# Patient Record
Sex: Male | Born: 1960 | Race: Black or African American | Hispanic: No | Marital: Single | Smoking: Current every day smoker
Health system: Southern US, Community
[De-identification: ages and names within clinical notes are randomized; demographics above are authoritative.]

## PROBLEM LIST (undated history)

## (undated) DIAGNOSIS — F1411 Cocaine abuse, in remission: Secondary | ICD-10-CM

## (undated) DIAGNOSIS — J449 Chronic obstructive pulmonary disease, unspecified: Secondary | ICD-10-CM

## (undated) DIAGNOSIS — E782 Mixed hyperlipidemia: Secondary | ICD-10-CM

## (undated) DIAGNOSIS — H409 Unspecified glaucoma: Secondary | ICD-10-CM

## (undated) DIAGNOSIS — G510 Bell's palsy: Secondary | ICD-10-CM

## (undated) DIAGNOSIS — I10 Essential (primary) hypertension: Secondary | ICD-10-CM

## (undated) DIAGNOSIS — Z8679 Personal history of other diseases of the circulatory system: Secondary | ICD-10-CM

## (undated) HISTORY — DX: Essential (primary) hypertension: I10

## (undated) HISTORY — DX: Personal history of other diseases of the circulatory system: Z86.79

## (undated) HISTORY — PX: CATARACT EXTRACTION: SUR2

## (undated) HISTORY — PX: OTHER SURGICAL HISTORY: SHX169

## (undated) HISTORY — DX: Cocaine abuse, in remission: F14.11

## (undated) HISTORY — DX: Unspecified glaucoma: H40.9

## (undated) HISTORY — DX: Chronic obstructive pulmonary disease, unspecified: J44.9

## (undated) HISTORY — DX: Bell's palsy: G51.0

## (undated) HISTORY — DX: Mixed hyperlipidemia: E78.2

---

## 2017-02-22 DIAGNOSIS — I639 Cerebral infarction, unspecified: Secondary | ICD-10-CM

## 2017-02-22 HISTORY — DX: Cerebral infarction, unspecified: I63.9

## 2017-08-07 ENCOUNTER — Ambulatory Visit (HOSPITAL_COMMUNITY)
Admission: RE | Admit: 2017-08-07 | Discharge: 2017-08-07 | Disposition: A | Discharge status: Home / Self Care | Payer: Medicaid Other | Source: Ambulatory Visit | Attending: Internal Medicine | Admitting: Internal Medicine

## 2017-08-07 ENCOUNTER — Other Ambulatory Visit (HOSPITAL_COMMUNITY): Payer: Self-pay | Admitting: Internal Medicine

## 2017-08-07 DIAGNOSIS — M25521 Pain in right elbow: Secondary | ICD-10-CM | POA: Diagnosis not present

## 2017-08-07 DIAGNOSIS — R52 Pain, unspecified: Secondary | ICD-10-CM

## 2017-08-07 DIAGNOSIS — M79631 Pain in right forearm: Secondary | ICD-10-CM | POA: Insufficient documentation

## 2017-08-31 ENCOUNTER — Emergency Department (HOSPITAL_COMMUNITY): Payer: Medicaid Other

## 2017-08-31 ENCOUNTER — Observation Stay (HOSPITAL_COMMUNITY)
Admission: EM | Admit: 2017-08-31 | Discharge: 2017-09-02 | Disposition: A | Discharge status: Home / Self Care | Payer: Medicaid Other | Attending: Internal Medicine | Admitting: Internal Medicine

## 2017-08-31 ENCOUNTER — Encounter (HOSPITAL_COMMUNITY): Payer: Self-pay | Admitting: Emergency Medicine

## 2017-08-31 ENCOUNTER — Observation Stay (HOSPITAL_COMMUNITY): Payer: Medicaid Other

## 2017-08-31 ENCOUNTER — Other Ambulatory Visit: Payer: Self-pay

## 2017-08-31 DIAGNOSIS — G518 Other disorders of facial nerve: Principal | ICD-10-CM | POA: Insufficient documentation

## 2017-08-31 DIAGNOSIS — R29898 Other symptoms and signs involving the musculoskeletal system: Secondary | ICD-10-CM

## 2017-08-31 DIAGNOSIS — R29810 Facial weakness: Secondary | ICD-10-CM | POA: Diagnosis not present

## 2017-08-31 DIAGNOSIS — R202 Paresthesia of skin: Secondary | ICD-10-CM | POA: Insufficient documentation

## 2017-08-31 DIAGNOSIS — Z8673 Personal history of transient ischemic attack (TIA), and cerebral infarction without residual deficits: Secondary | ICD-10-CM | POA: Insufficient documentation

## 2017-08-31 DIAGNOSIS — G510 Bell's palsy: Secondary | ICD-10-CM

## 2017-08-31 DIAGNOSIS — F17210 Nicotine dependence, cigarettes, uncomplicated: Secondary | ICD-10-CM | POA: Diagnosis not present

## 2017-08-31 DIAGNOSIS — I639 Cerebral infarction, unspecified: Secondary | ICD-10-CM

## 2017-08-31 DIAGNOSIS — I10 Essential (primary) hypertension: Secondary | ICD-10-CM

## 2017-08-31 DIAGNOSIS — H02401 Unspecified ptosis of right eyelid: Secondary | ICD-10-CM | POA: Diagnosis not present

## 2017-08-31 DIAGNOSIS — Z79899 Other long term (current) drug therapy: Secondary | ICD-10-CM | POA: Insufficient documentation

## 2017-08-31 DIAGNOSIS — Z7982 Long term (current) use of aspirin: Secondary | ICD-10-CM | POA: Diagnosis not present

## 2017-08-31 DIAGNOSIS — Z720 Tobacco use: Secondary | ICD-10-CM

## 2017-08-31 DIAGNOSIS — J449 Chronic obstructive pulmonary disease, unspecified: Secondary | ICD-10-CM | POA: Diagnosis not present

## 2017-08-31 LAB — PROTIME-INR
INR: 1.06
Prothrombin Time: 13.7 seconds (ref 11.4–15.2)

## 2017-08-31 LAB — BASIC METABOLIC PANEL
Anion gap: 7 (ref 5–15)
BUN: 17 mg/dL (ref 6–20)
CO2: 27 mmol/L (ref 22–32)
Calcium: 9.5 mg/dL (ref 8.9–10.3)
Chloride: 105 mmol/L (ref 98–111)
Creatinine, Ser: 0.9 mg/dL (ref 0.61–1.24)
GFR calc Af Amer: >60 mL/min (ref >60)
GFR calc non Af Amer: >60 mL/min (ref >60)
Glucose, Bld: 92 mg/dL (ref 70–99)
Potassium: 3.8 mmol/L (ref 3.5–5.1)
Sodium: 139 mmol/L (ref 135–145)

## 2017-08-31 LAB — CBC WITH DIFFERENTIAL/PLATELET
Basophils Absolute: 0 10*3/uL (ref 0.0–0.1)
Basophils Relative: 0 %
Eosinophils Absolute: 0.2 10*3/uL (ref 0.0–0.7)
Eosinophils Relative: 3 %
HCT: 43.7 % (ref 39.0–52.0)
Hemoglobin: 15 g/dL (ref 13.0–17.0)
Lymphocytes Relative: 29 %
Lymphs Abs: 1.8 10*3/uL (ref 0.7–4.0)
MCH: 31.6 pg (ref 26.0–34.0)
MCHC: 34.3 g/dL (ref 30.0–36.0)
MCV: 92.2 fL (ref 78.0–100.0)
Monocytes Absolute: 0.6 10*3/uL (ref 0.1–1.0)
Monocytes Relative: 9 %
Neutro Abs: 3.7 10*3/uL (ref 1.7–7.7)
Neutrophils Relative %: 59 %
Platelets: 193 10*3/uL (ref 150–400)
RBC: 4.74 MIL/uL (ref 4.22–5.81)
RDW: 13.4 % (ref 11.5–15.5)
WBC: 6.3 10*3/uL (ref 4.0–10.5)

## 2017-08-31 LAB — URINALYSIS, ROUTINE W REFLEX MICROSCOPIC
Bilirubin Urine: NEGATIVE
Glucose, UA: NEGATIVE mg/dL
Hgb urine dipstick: NEGATIVE
Ketones, ur: NEGATIVE mg/dL
Leukocytes, UA: NEGATIVE
Nitrite: NEGATIVE
Protein, ur: NEGATIVE mg/dL
Specific Gravity, Urine: 1.018 (ref 1.005–1.030)
pH: 5 (ref 5.0–8.0)

## 2017-08-31 LAB — APTT: aPTT: 29 seconds (ref 24–36)

## 2017-08-31 MED ORDER — ATORVASTATIN CALCIUM 40 MG PO TABS
40.0000 mg | ORAL_TABLET | Freq: Every day | ORAL | Status: DC
  Administered 2017-08-31 – 2017-09-02 (×3): 40 mg via ORAL
  Filled 2017-08-31 (×3): qty 1

## 2017-08-31 MED ORDER — STROKE: EARLY STAGES OF RECOVERY BOOK
Freq: Once | Status: DC
  Filled 2017-08-31: qty 1

## 2017-08-31 MED ORDER — ACETAMINOPHEN 650 MG RE SUPP: 650.0000 mg | RECTAL | Status: DC | PRN

## 2017-08-31 MED ORDER — CLOPIDOGREL BISULFATE 75 MG PO TABS
75.0000 mg | ORAL_TABLET | Freq: Every day | ORAL | Status: DC
  Administered 2017-09-01: 75 mg via ORAL
  Filled 2017-08-31: qty 1

## 2017-08-31 MED ORDER — ACETAMINOPHEN 160 MG/5ML PO SOLN: 650.0000 mg | ORAL | Status: DC | PRN

## 2017-08-31 MED ORDER — ACETAMINOPHEN 500 MG PO TABS
1000.0000 mg | ORAL_TABLET | Freq: Once | ORAL | Status: AC
  Administered 2017-08-31: 1000 mg via ORAL
  Filled 2017-08-31: qty 2

## 2017-08-31 MED ORDER — LATANOPROST 0.005 % OP SOLN
OPHTHALMIC | Status: AC
  Filled 2017-08-31: qty 2.5

## 2017-08-31 MED ORDER — ACETAMINOPHEN 325 MG PO TABS: 650.0000 mg | ORAL_TABLET | ORAL | Status: DC | PRN

## 2017-08-31 MED ORDER — NICOTINE 14 MG/24HR TD PT24
14.0000 mg | MEDICATED_PATCH | Freq: Every day | TRANSDERMAL | Status: DC
  Administered 2017-08-31 – 2017-09-02 (×3): 14 mg via TRANSDERMAL
  Filled 2017-08-31 (×3): qty 1

## 2017-08-31 MED ORDER — ASPIRIN 300 MG RE SUPP
300.0000 mg | Freq: Every day | RECTAL | Status: DC
  Filled 2017-08-31: qty 1

## 2017-08-31 MED ORDER — PREDNISOLONE ACETATE 1 % OP SUSP
OPHTHALMIC | Status: AC
  Filled 2017-08-31: qty 5

## 2017-08-31 MED ORDER — BRIMONIDINE TARTRATE 0.15 % OP SOLN
1.0000 [drp] | Freq: Three times a day (TID) | OPHTHALMIC | Status: DC
  Administered 2017-09-01 – 2017-09-02 (×5): 1 [drp] via OPHTHALMIC
  Filled 2017-08-31: qty 5

## 2017-08-31 MED ORDER — SERTRALINE HCL 50 MG PO TABS
50.0000 mg | ORAL_TABLET | Freq: Every day | ORAL | Status: DC
  Administered 2017-09-01 – 2017-09-02 (×2): 50 mg via ORAL
  Filled 2017-08-31 (×2): qty 1

## 2017-08-31 MED ORDER — PREDNISOLONE ACETATE 1 % OP SUSP
1.0000 [drp] | Freq: Two times a day (BID) | OPHTHALMIC | Status: DC
  Administered 2017-09-01 – 2017-09-02 (×3): 1 [drp] via OPHTHALMIC
  Filled 2017-08-31: qty 1

## 2017-08-31 MED ORDER — FUROSEMIDE 40 MG PO TABS
40.0000 mg | ORAL_TABLET | Freq: Two times a day (BID) | ORAL | Status: DC
  Administered 2017-09-01 – 2017-09-02 (×4): 40 mg via ORAL
  Filled 2017-08-31 (×5): qty 1

## 2017-08-31 MED ORDER — DORZOLAMIDE HCL-TIMOLOL MAL 2-0.5 % OP SOLN
1.0000 [drp] | Freq: Two times a day (BID) | OPHTHALMIC | Status: DC
  Administered 2017-09-01 – 2017-09-02 (×3): 1 [drp] via OPHTHALMIC
  Filled 2017-08-31: qty 10

## 2017-08-31 MED ORDER — ASPIRIN 81 MG PO CHEW
324.0000 mg | CHEWABLE_TABLET | Freq: Once | ORAL | Status: AC
  Administered 2017-08-31: 324 mg via ORAL
  Filled 2017-08-31: qty 4

## 2017-08-31 MED ORDER — MOMETASONE FURO-FORMOTEROL FUM 100-5 MCG/ACT IN AERO
2.0000 | INHALATION_SPRAY | Freq: Two times a day (BID) | RESPIRATORY_TRACT | Status: DC
  Administered 2017-08-31 – 2017-09-02 (×4): 2 via RESPIRATORY_TRACT
  Filled 2017-08-31 (×2): qty 8.8

## 2017-08-31 MED ORDER — LATANOPROST 0.005 % OP SOLN
1.0000 [drp] | Freq: Every day | OPHTHALMIC | Status: DC
  Administered 2017-09-01: 1 [drp] via OPHTHALMIC
  Filled 2017-08-31: qty 2.5

## 2017-08-31 MED ORDER — ASPIRIN 325 MG PO TABS
325.0000 mg | ORAL_TABLET | Freq: Every day | ORAL | Status: DC
  Administered 2017-09-01: 325 mg via ORAL
  Filled 2017-08-31: qty 1

## 2017-08-31 MED ORDER — ENOXAPARIN SODIUM 40 MG/0.4ML ~~LOC~~ SOLN
40.0000 mg | SUBCUTANEOUS | Status: DC
  Administered 2017-08-31 – 2017-09-01 (×2): 40 mg via SUBCUTANEOUS
  Filled 2017-08-31 (×3): qty 0.4

## 2017-08-31 MED ORDER — SODIUM CHLORIDE 0.9 % IV SOLN
INTRAVENOUS | Status: DC
  Administered 2017-08-31 – 2017-09-02 (×3): via INTRAVENOUS

## 2017-08-31 NOTE — ED Notes (Signed)
Pt transported to CT ?

## 2017-08-31 NOTE — ED Notes (Signed)
teleneurologist contacted at this time for consult. Nija Koopman

## 2017-08-31 NOTE — H&P (Signed)
History and Physical    Dustin Fuller ZOX:[id number] DOB: [birth year] DOA: 08/31/2017  PCP: Benita Stabile, MD  Patient coming from: Home  I have personally briefly reviewed patient's old medical records in [facility]  Chief Complaint: Right facial numbness and weakness  HPI: Dustin Fuller is a 57 y.o. male with medical history significant of atrial fibrillation in the past, status post ablation currently in sinus rhythm, history of stroke in the past, COPD, Bell's palsy, hypertension, ongoing tobacco use presents to the hospital with right facial numbness and weakness.  Patient reports that for the past 1 to 2 days, he has been experiencing right facial numbness, occasional paresthesias and some weakness.  He transiently noted that his speech was slurred, but this is resolved.  He is also noted numbness and paresthesias in his right arm.  To a lesser degree in his right leg.  He is not had any difficulty walking or trouble with balance.  He has recently had eye surgery, but has not noticed any abrupt changes in vision in the last 1 to 2 days.  No fever, cough, vomiting, diarrhea.  ED Course: CT scan of the head in the emergency room did not show any acute process.  Basic labs were unrevealing.  Due to concern for underlying CVA, he is been referred for admission.  Review of Systems: As per HPI otherwise 10 point review of systems negative.    Past Medical History:  Diagnosis Date  . Stroke [facility])     History reviewed. No pertinent surgical history.   reports that he has been smoking cigarettes.  He has been smoking about 0.25 packs per day. He has never used smokeless tobacco. He reports that he drank alcohol. He reports that he has current or past drug history.  Not on File  Family history: Family history reviewed and not pertinent  Prior to Admission medications   Medication Sig Start Date End Date Taking? Authorizing Provider  albuterol (PROVENTIL HFA) 108 (90 Base)  MCG/ACT inhaler Inhale 2 puffs into the lungs every 4 (four) hours as needed. 06/16/17  Yes [provider]  ALPHAGAN P 0.1 % SOLN Place 1 drop into both eyes. 08/15/17  Yes [provider]  aspirin 81 MG EC tablet Take 1 tablet by mouth daily. 06/17/17 06/17/18 Yes [provider]  diclofenac (VOLTAREN) 75 MG EC tablet Take 75 mg by mouth 2 (two) times daily. for 10 days 08/06/17  Yes [provider]  dorzolamide-timolol (COSOPT) 22.3-6.8 MG/ML ophthalmic solution Place 1 drop into both eyes 2 (two) times daily. 08/26/17  Yes [provider]  furosemide (LASIX) 40 MG tablet Take 40 mg by mouth 2 (two) times daily. 08/15/17  Yes [provider]  labetalol (NORMODYNE) 200 MG tablet Take 200 mg by mouth 2 (two) times daily. 08/15/17  Yes [provider]  latanoprost (XALATAN) 0.005 % ophthalmic solution Place 1 drop into both eyes at bedtime. 08/15/17  Yes [provider]  losartan (COZAAR) 100 MG tablet Take 100 mg by mouth daily. 08/15/17  Yes [provider]  NIFEdipine (PROCARDIA-XL/ADALAT-CC/NIFEDICAL-XL) 30 MG 24 hr tablet Take 30 mg by mouth daily. 08/15/17  Yes [provider]  prednisoLONE acetate (PRED FORTE) 1 % ophthalmic suspension Place 1 drop into both eyes 2 (two) times daily. 08/15/17  Yes [provider]  sertraline (ZOLOFT) 50 MG tablet Take 50 mg by mouth daily. 07/19/17  Yes [provider]  SYMBICORT 80-4.5 MCG/ACT inhaler Inhale 2 puffs  into the lungs 2 (two) times daily. 08/15/17  Yes [provider]    Physical Exam: Vitals:   08/31/17 1400 08/31/17 1430 08/31/17 1500 08/31/17 1530  BP: (!) 148/88 127/75 138/89 133/80  Pulse: 76 71 72 73  Resp: 19 16 14 18   Temp:      TempSrc:      SpO2: 98% 100% 98% 97%    Constitutional: NAD, calm, comfortable Vitals:   08/31/17 1400 08/31/17 1430 08/31/17 1500 08/31/17 1530  BP: (!) 148/88 127/75 138/89 133/80  Pulse: 76 71 72  73  Resp: 19 16 14 18   Temp:      TempSrc:      SpO2: 98% 100% 98% 97%   Eyes: PERRL, lids and conjunctivae normal ENMT: Mucous membranes are moist. Posterior pharynx clear of any exudate or lesions.Normal dentition.  Neck: normal, supple, no masses, no thyromegaly Respiratory: clear to auscultation bilaterally, no wheezing, no crackles. Normal respiratory effort. No accessory muscle use.  Cardiovascular: Regular rate and rhythm, no murmurs / rubs / gallops. No extremity edema. 2+ pedal pulses. No carotid bruits.  Abdomen: no tenderness, no masses palpated. No hepatosplenomegaly. Bowel sounds positive.  Musculoskeletal: no clubbing / cyanosis. No joint deformity upper and lower extremities. Good ROM, no contractures. Normal muscle tone.  Skin: no rashes, lesions, ulcers. No induration Neurologic: Strength is equal bilaterally.  Mild right-sided facial droop.  Grossly, cranial nerves are intact Psychiatric: Normal judgment and insight. Alert and oriented x 3. Normal mood.    Labs on Admission: I have personally reviewed following labs and imaging studies  CBC: Recent Labs  Lab 08/31/17 1313  WBC 6.3  NEUTROABS 3.7  HGB 15.0  HCT 43.7  MCV 92.2  PLT 193   Basic Metabolic Panel: Recent Labs  Lab 08/31/17 1313  NA 139  K 3.8  CL 105  CO2 27  GLUCOSE 92  BUN 17  CREATININE 0.90  CALCIUM 9.5   GFR: CrCl cannot be calculated (Unknown ideal weight.). Liver Function Tests: No results for input(s): AST, ALT, ALKPHOS, BILITOT, PROT, ALBUMIN in the last 168 hours. No results for input(s): LIPASE, AMYLASE in the last 168 hours. No results for input(s): AMMONIA in the last 168 hours. Coagulation Profile: Recent Labs  Lab 08/31/17 1313  INR 1.06   Cardiac Enzymes: No results for input(s): CKTOTAL, CKMB, CKMBINDEX, TROPONINI in the last 168 hours. BNP (last 3 results) No results for input(s): PROBNP in the last 8760 hours. HbA1C: No results for input(s): HGBA1C in the  last 72 hours. CBG: No results for input(s): GLUCAP in the last 168 hours. Lipid Profile: No results for input(s): CHOL, HDL, LDLCALC, TRIG, CHOLHDL, LDLDIRECT in the last 72 hours. Thyroid Function Tests: No results for input(s): TSH, T4TOTAL, FREET4, T3FREE, THYROIDAB in the last 72 hours. Anemia Panel: No results for input(s): VITAMINB12, FOLATE, FERRITIN, TIBC, IRON, RETICCTPCT in the last 72 hours. Urine analysis:    Component Value Date/Time   COLORURINE YELLOW 08/31/2017 1313   APPEARANCEUR CLEAR 08/31/2017 1313   LABSPEC 1.018 08/31/2017 1313   PHURINE 5.0 08/31/2017 1313   GLUCOSEU NEGATIVE 08/31/2017 1313   HGBUR NEGATIVE 08/31/2017 1313   BILIRUBINUR NEGATIVE 08/31/2017 1313   KETONESUR NEGATIVE 08/31/2017 1313   PROTEINUR NEGATIVE 08/31/2017 1313   NITRITE NEGATIVE 08/31/2017 1313   LEUKOCYTESUR NEGATIVE 08/31/2017 1313    Radiological Exams on Admission: Ct Head Wo Contrast  Result Date: 08/31/2017 CLINICAL DATA:  Slurred speech beginning yesterday. EXAM: CT HEAD WITHOUT CONTRAST TECHNIQUE:  Contiguous axial images were obtained from the base of the skull through the vertex without intravenous contrast. COMPARISON:  None. FINDINGS: Brain: The brain shows a normal appearance without evidence of malformation, atrophy, old or acute small or large vessel infarction, mass lesion, hemorrhage, hydrocephalus or extra-axial collection. Vascular: There is atherosclerotic calcification of the major vessels at the base of the brain. Skull: Normal.  No traumatic finding.  No focal bone lesion. Sinuses/Orbits: Sinuses are clear. Orbits appear normal. Mastoids are clear. Other: None significant IMPRESSION: Normal head CT with exception of considerable atherosclerotic calcification of the major vessels at the base of the brain. Electronically Signed   By: Paulina Fusi M.D.   On: 08/31/2017 13:56    EKG: Independently reviewed.  Sinus rhythm without acute changes  Assessment/Plan Active  Problems:   Facial droop   Tobacco abuse   COPD (chronic obstructive pulmonary disease) (HCC)   HTN (hypertension)     1. Right facial droop.  Concern for underlying CVA.  Seen by tele neurology.  Not a candidate for TPA due to delay in patient arrival.  Will start on aspirin Plavix.  Further work-up with MRI, carotid Dopplers.  Start high intensity statin.  Check lipid panel and A1c.  Physical therapy has been ordered. 2. COPD.  No evidence of wheezing.  Continue bronchodilators. 3. Tobacco use.  Nicotine patch. 4. Hypertension.  Hold antihypertensives to allow for permissive hypertension.  DVT prophylaxis: lovenox  Code Status: full code  Family Communication: no family present  Disposition Plan: discharge home once improved  Consults called: Neurology Admission status: Observation, telemetry  Erick Blinks MD [facility] Pager [phone]  If 7PM-7AM, please contact night-coverage [email or web address] Password [email or web address]  08/31/2017, 4:55 PM

## 2017-08-31 NOTE — Plan of Care (Signed)
  Problem: Education: Goal: Knowledge of General Education information will improve Outcome: Progressing   Problem: Health Behavior/Discharge Planning: Goal: Ability to manage health-related needs will improve Outcome: Progressing   Problem: Coping: Goal: Will verbalize positive feelings about self Outcome: Progressing Goal: Will identify appropriate support needs Outcome: Progressing   Problem: Health Behavior/Discharge Planning: Goal: Ability to manage health-related needs will improve Outcome: Progressing   Problem: Self-Care: Goal: Ability to communicate needs accurately will improve Outcome: Progressing

## 2017-08-31 NOTE — ED Provider Notes (Signed)
[facility] EMERGENCY DEPARTMENT Provider Note   CSN: [id number] Arrival date & time: 08/31/17  1257   An emergency department physician performed an initial assessment on this suspected stroke patient at 1312.  History   Chief Complaint Chief Complaint  Patient presents with  . Facial Droop    HPI Lorik Guo is a 57 y.o. male.  HPI   57 year old male with right sided facial weakness and right upper extremity weakness.  First noticed around 6 AM yesterday.  Persistent since then.  He first noticed a change in his speech and then progressed to facial numbness/weakness.  Some change in taste.  No change in his hearing.  Denies any acute pain.  Some tingling in his right upper extremity but denies any weakness.  He reports a prior history of stroke but no residual deficits.  Past Medical History:  Diagnosis Date  . Stroke [facility])     There are no active problems to display for this patient.   History reviewed. No pertinent surgical history.      Home Medications    Prior to Admission medications   Medication Sig Start Date End Date Taking? Authorizing Provider  albuterol (PROVENTIL HFA) 108 (90 Base) MCG/ACT inhaler Inhale 2 puffs into the lungs every 4 (four) hours as needed. 06/16/17  Yes [provider]  ALPHAGAN P 0.1 % SOLN Place 1 drop into both eyes. 08/15/17  Yes [provider]  aspirin 81 MG EC tablet Take 1 tablet by mouth daily. 06/17/17 06/17/18 Yes [provider]  diclofenac (VOLTAREN) 75 MG EC tablet Take 75 mg by mouth 2 (two) times daily. for 10 days 08/06/17  Yes [provider]  dorzolamide-timolol (COSOPT) 22.3-6.8 MG/ML ophthalmic solution Place 1 drop into both eyes 2 (two) times daily. 08/26/17  Yes [provider]  furosemide (LASIX) 40 MG tablet Take 40 mg by mouth 2 (two) times daily. 08/15/17  Yes [provider]  labetalol (NORMODYNE) 200 MG tablet Take 200 mg by mouth 2 (two) times daily.  08/15/17  Yes [provider]  latanoprost (XALATAN) 0.005 % ophthalmic solution Place 1 drop into both eyes at bedtime. 08/15/17  Yes [provider]  losartan (COZAAR) 100 MG tablet Take 100 mg by mouth daily. 08/15/17  Yes [provider]  NIFEdipine (PROCARDIA-XL/ADALAT-CC/NIFEDICAL-XL) 30 MG 24 hr tablet Take 30 mg by mouth daily. 08/15/17  Yes [provider]  prednisoLONE acetate (PRED FORTE) 1 % ophthalmic suspension Place 1 drop into both eyes 2 (two) times daily. 08/15/17  Yes [provider]  sertraline (ZOLOFT) 50 MG tablet Take 50 mg by mouth daily. 07/19/17  Yes [provider]  SYMBICORT 80-4.5 MCG/ACT inhaler Inhale 2 puffs into the lungs 2 (two) times daily. 08/15/17  Yes [provider]    Family History No family history on file.  Social History Social History   Tobacco Use  . Smoking status: Current Every Day Smoker    Packs/day: 0.25    Types: Cigarettes  . Smokeless tobacco: Never Used  Substance Use Topics  . Alcohol use: Not Currently  . Drug use: Not Currently     Allergies   Patient has no allergy information on record.   Review of Systems Review of Systems  All systems reviewed and negative, other than as noted in HPI.  Physical Exam Updated Vital Signs BP (!) 146/89 (BP Location: Left Arm)   Pulse 80   Temp 97.8 F (36.6 C)   Resp 19  SpO2 99%   Physical Exam  Constitutional: He is oriented to person, place, and time. He appears well-developed and well-nourished. No distress.  HENT:  Head: Normocephalic and atraumatic.  Eyes: Conjunctivae are normal. Right eye exhibits no discharge. Left eye exhibits no discharge.  Neck: Neck supple.  Cardiovascular: Normal rate, regular rhythm and normal heart sounds. Exam reveals no gallop and no friction rub.  No murmur heard. Pulmonary/Chest: Effort normal and breath sounds normal. No respiratory distress.  Abdominal: Soft. He exhibits no  distension. There is no tenderness.  Musculoskeletal: He exhibits no edema or tenderness.  Neurological: He is alert and oriented to person, place, and time.  Right facial droop.  Right eye ptosis.  His forehead does NOT appear to be spared. CN 2-12 otherwise intact. Pronator drift on right.  Is 5 out of 5 all other extremities.  Sensation is intact to light touch.  Good finger-to-nose testing bilaterally.  Skin: Skin is warm and dry.  Psychiatric: He has a normal mood and affect. His behavior is normal. Thought content normal.  Nursing note and vitals reviewed.    ED Treatments / Results  Labs (all labs ordered are listed, but only abnormal results are displayed) Labs Reviewed  HEMOGLOBIN A1C - Abnormal; Notable for the following components:      Result Value   Hgb A1c MFr Bld 6.1 (*)    All other components within normal limits  LIPID PANEL - Abnormal; Notable for the following components:   Cholesterol 206 (*)    LDL Cholesterol 135 (*)    All other components within normal limits  CBC WITH DIFFERENTIAL/PLATELET  BASIC METABOLIC PANEL  URINALYSIS, ROUTINE W REFLEX MICROSCOPIC  APTT  PROTIME-INR  VITAMIN B12  TSH  C-REACTIVE PROTEIN  SEDIMENTATION RATE  HIV ANTIBODY (ROUTINE TESTING)  HOMOCYSTEINE  RPR  ANTINUCLEAR ANTIBODIES, IFA    EKG None   Radiology Ct Head Wo Contrast  Result Date: 08/31/2017 CLINICAL DATA:  Slurred speech beginning yesterday. EXAM: CT HEAD WITHOUT CONTRAST TECHNIQUE: Contiguous axial images were obtained from the base of the skull through the vertex without intravenous contrast. COMPARISON:  None. FINDINGS: Brain: The brain shows a normal appearance without evidence of malformation, atrophy, old or acute small or large vessel infarction, mass lesion, hemorrhage, hydrocephalus or extra-axial collection. Vascular: There is atherosclerotic calcification of the major vessels at the base of the brain. Skull: Normal.  No traumatic finding.  No focal  bone lesion. Sinuses/Orbits: Sinuses are clear. Orbits appear normal. Mastoids are clear. Other: None significant IMPRESSION: Normal head CT with exception of considerable atherosclerotic calcification of the major vessels at the base of the brain. Electronically Signed   By: Paulina FusiMark  Shogry M.D.   On: 08/31/2017 13:56    Procedures Procedures (including critical care time)  Medications Ordered in ED Medications  aspirin chewable tablet 324 mg (324 mg Oral Given 08/31/17 1410)  acetaminophen (TYLENOL) tablet 1,000 mg (1,000 mg Oral Given 08/31/17 1410)     Initial Impression / Assessment and Plan / ED Course  I have reviewed the triage vital signs and the nursing notes.  Pertinent labs & imaging results that were available during my care of the patient were reviewed by me and considered in my medical decision making (see chart for details).    57 year old male with right facial weakness.  Forehead does not seem spared to me.  Consider Bell's palsy although he has other symptoms not isolated to a facial nerve palsy.  CT the head does  not show any acute abnormality.  Will obtain tele-neurology consultation.  Admission for further work-up.  Final Clinical Impressions(s) / ED Diagnoses   Final diagnoses:  Facial nerve palsy  Right arm weakness    ED Discharge Orders    None       Raeford Razor, MD 09/02/17 [phone]

## 2017-08-31 NOTE — ED Notes (Signed)
Tele neuro consult at this time.

## 2017-08-31 NOTE — ED Triage Notes (Signed)
Pt states 6 am on Tuesday morning he notice he was having slurred speech. Hx strokes, pt did not want to come in. Today he has onset of right sided facial droop.

## 2017-08-31 NOTE — Consult Note (Signed)
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  He is not a candidate for acute thrombolytic therapy or any sort of emergent treatment.  He does however need a workup for stroke evaluation.  Because he does have evidence of possibly some atheromatous disease in the posterior circulation with what appears to be a more brainstem type clinical picture, recommend high intensity statin therapy, doing antiplatelet therapy with aspirin 81 mg and Plavix 75 mg until his diagnosis can be better clarified.  He does of course have a history of atrial fibrillation although he's been told this was successfully ablated could also be an additional mechanism of stroke.  Would allow permissive hypertension for non-TPA stroke protocol until 6 AM tomorrow morning.  Recommend inpatient neurology consultation and evaluation.  He'll need speech therapy, physical and occupational therapy evaluations.    --------------------------------------------------------------------- CC: Right-sided weakness slurred speech  History of Present Illness: The patient is a very pleasant 57 year old gentleman with a history of atrial fibrillation status post ablation who only takes aspirin daily.  Also has a history of hypertension and recent cataract and glaucoma surgery.  He smokes one half a pack per day of cigarettes.  Yesterday at 6 in the morning he noted the onset of right-sided weakness and his face arm and leg with some numbness as well.  He also noted some droopiness to his right eyelid.  He has no history of stroke.  When the symptoms became a little bit worse he presented to the hospital for evaluation. Diagnostic Testing: CT head without contrast no acute changes  Vital Signs: Blood pressure moderately elevated afebrile  Exam: 1a- LOC: Keenly responsive - =0     1b- LOC questions: Answers both questions  correctly - 0     1c- LOC commands- Performs both tasks correctly- 0     2- Gaze: Normal; no gaze paresis or gaze deviation - 0     3- Visual Fields: normal, no Visual field deficit - 0     4- Facial movements: right facial asymmetry=1 5- Upper limb motor - no drift -0    No drift but feels right arm and leg are heavier 6- Lower limb motor - no drift - 0      7- Limb Coordination: absent ataxia - 0      8- Sensory : dec R face/arm/leg =1   9- Language - No aphasia - 0      10- Speech - No dysarthria -0   =1 11Extinction - none found -0     NIHSS score  3   Medical Decision Making: - Extensive number of diagnosis or management options are considered above. - Extensive amount of complex data reviewed. - High risk of complication and/or morbidity or mortality are associated with differential diagnostic considerations above.  - There may be uncertain outcome and increased probability of prolonged functional impairment or high probability of severe prolonged functional impairment associated with some of these differential diagnosis.  Medical Data Reviewed: 1.Data reviewed include clinical labs, radiology, Medical Tests; 2.Tests results discussed w/performing or interpreting physician; 3.Obtaining/reviewing old medical records; 4.Obtaining case history from another source; 5.Independent review of image, tracing or specimen. Patient was informed the Neurology Consult would happen via telehealth (remote video) and consented to receiving care in this manner.

## 2017-09-01 ENCOUNTER — Observation Stay (HOSPITAL_COMMUNITY): Payer: Medicaid Other

## 2017-09-01 ENCOUNTER — Observation Stay (HOSPITAL_BASED_OUTPATIENT_CLINIC_OR_DEPARTMENT_OTHER): Payer: Medicaid Other

## 2017-09-01 DIAGNOSIS — Z720 Tobacco use: Secondary | ICD-10-CM | POA: Diagnosis not present

## 2017-09-01 DIAGNOSIS — J449 Chronic obstructive pulmonary disease, unspecified: Secondary | ICD-10-CM | POA: Diagnosis not present

## 2017-09-01 DIAGNOSIS — I10 Essential (primary) hypertension: Secondary | ICD-10-CM

## 2017-09-01 LAB — ECHOCARDIOGRAM COMPLETE
Height: 71 in
WEIGHTICAEL: 3468.8 [oz_av]

## 2017-09-01 LAB — SEDIMENTATION RATE: Sed Rate: 1 mm/hr (ref 0–16)

## 2017-09-01 LAB — LIPID PANEL
Cholesterol: 206 mg/dL — ABNORMAL HIGH (ref 0–200)
HDL: 56 mg/dL (ref >40)
LDL CALC: 135 mg/dL — AB (ref 0–99)
Total CHOL/HDL Ratio: 3.7 RATIO
Triglycerides: 75 mg/dL (ref <150)
VLDL: 15 mg/dL (ref 0–40)

## 2017-09-01 LAB — HEMOGLOBIN A1C
Hgb A1c MFr Bld: 6.1 % — ABNORMAL HIGH (ref 4.8–5.6)
Mean Plasma Glucose: 128.37 mg/dL

## 2017-09-01 LAB — C-REACTIVE PROTEIN: CRP: 0.8 mg/dL (ref <1.0)

## 2017-09-01 LAB — TSH: TSH: 0.551 u[IU]/mL (ref 0.350–4.500)

## 2017-09-01 MED ORDER — LOSARTAN POTASSIUM 50 MG PO TABS
100.0000 mg | ORAL_TABLET | Freq: Every day | ORAL | Status: DC
  Administered 2017-09-01 – 2017-09-02 (×2): 100 mg via ORAL
  Filled 2017-09-01 (×2): qty 2

## 2017-09-01 MED ORDER — LABETALOL HCL 200 MG PO TABS
200.0000 mg | ORAL_TABLET | Freq: Two times a day (BID) | ORAL | Status: DC
Start: 2017-09-01 — End: 2017-09-02
  Administered 2017-09-01 – 2017-09-02 (×2): 200 mg via ORAL
  Filled 2017-09-01 (×2): qty 1

## 2017-09-01 MED ORDER — GADOBENATE DIMEGLUMINE 529 MG/ML IV SOLN
20.0000 mL | Freq: Once | INTRAVENOUS | Status: AC | PRN
  Administered 2017-09-01: 20 mL via INTRAVENOUS

## 2017-09-01 MED ORDER — NIFEDIPINE ER OSMOTIC RELEASE 30 MG PO TB24
30.0000 mg | ORAL_TABLET | Freq: Every day | ORAL | Status: DC
  Administered 2017-09-01 – 2017-09-02 (×2): 30 mg via ORAL
  Filled 2017-09-01 (×2): qty 1

## 2017-09-01 NOTE — Evaluation (Signed)
Physical Therapy Evaluation Patient Details Name: Dustin Fuller Lipton MRN: [id number] DOB: 07/18/1960 Today's Date: 09/01/2017   History of Present Illness  Dustin Fuller Milbrath is a 57 y.o. male with medical history significant of atrial fibrillation in the past, status post ablation currently in sinus rhythm, history of stroke in the past, COPD, Bell's palsy, hypertension, ongoing tobacco use presents to the hospital with right facial numbness and weakness.  Patient reports that for the past 1 to 2 days, he has been experiencing right facial numbness, occasional paresthesias and some weakness.  He transiently noted that his speech was slurred, but this is resolved.  He is also noted numbness and paresthesias in his right arm.  To a lesser degree in his right leg.  He is not had any difficulty walking or trouble with balance.  He has recently had eye surgery, but has not noticed any abrupt changes in vision in the last 1 to 2 days.  No fever, cough, vomiting, diarrhea.    Clinical Impression  Patient functioning at baseline for functional mobility and gait.  Plan:  Patient discharged from physical therapy to care of nursing for ambulation daily as tolerated for length of stay.    Follow Up Recommendations No PT follow up    Equipment Recommendations  None recommended by PT    Recommendations for Other Services       Precautions / Restrictions Precautions Precautions: None Restrictions Weight Bearing Restrictions: No      Mobility  Bed Mobility Overal bed mobility: Independent                Transfers Overall transfer level: Independent                  Ambulation/Gait Ambulation/Gait assistance: Independent Gait Distance (Feet): 200 Feet Assistive device: None Gait Pattern/deviations: WFL(Within Functional Limits)        Stairs Stairs: Yes Stairs assistance: Modified independent (Device/Increase time) Stair Management: One rail Right;One rail Left;Alternating  pattern Number of Stairs: 9 General stair comments: demonstrates good return for going up/down stairs using 1 siderail without loss of balance  Wheelchair Mobility    Modified Rankin (Stroke Patients Only)       Balance Overall balance assessment: No apparent balance deficits (not formally assessed)                                           Pertinent Vitals/Pain Pain Assessment: No/denies pain    Home Living Family/patient expects to be discharged to:: Group home                 Additional Comments: 2 story facility with 2 steps in front without side rails, 7 steps in back with right side rail, 12 steps to his bedroom with left siderail    Prior Function Level of Independence: Independent               Hand Dominance        Extremity/Trunk Assessment   Upper Extremity Assessment Upper Extremity Assessment: Overall WFL for tasks assessed    Lower Extremity Assessment Lower Extremity Assessment: Overall WFL for tasks assessed    Cervical / Trunk Assessment Cervical / Trunk Assessment: Normal  Communication   Communication: No difficulties  Cognition Arousal/Alertness: Awake/alert Behavior During Therapy: WFL for tasks assessed/performed Overall Cognitive Status: Within Functional Limits for tasks assessed  General Comments      Exercises     Assessment/Plan    PT Assessment Patent does not need any further PT services  PT Problem List         PT Treatment Interventions      PT Goals (Current goals can be found in the Care Plan section)  Acute Rehab PT Goals Patient Stated Goal: return home  PT Goal Formulation: With patient Time For Goal Achievement: September 23, 2017 Potential to Achieve Goals: Good    Frequency     Barriers to discharge        Co-evaluation               AM-PAC PT "6 Clicks" Daily Activity  Outcome Measure Difficulty turning over in bed  (including adjusting bedclothes, sheets and blankets)?: None Difficulty moving from lying on back to sitting on the side of the bed? : None Difficulty sitting down on and standing up from a chair with arms (e.g., wheelchair, bedside commode, etc,.)?: None Help needed moving to and from a bed to chair (including a wheelchair)?: None Help needed walking in hospital room?: None Help needed climbing 3-5 steps with a railing? : None 6 Click Score: 24    End of Session   Activity Tolerance: Patient tolerated treatment well Patient left: in bed;with call bell/phone within reach(seated at bedside) Nurse Communication: Mobility status PT Visit Diagnosis: Unsteadiness on feet (R26.81);Other abnormalities of gait and mobility (R26.89);Muscle weakness (generalized) (M62.81)    Time: 1610-9604 PT Time Calculation (min) (ACUTE ONLY): 19 min   Charges:   PT Evaluation $PT Eval Low Complexity: 1 Low PT Treatments $Gait Training: 8-22 mins   PT G Codes:        10:52 AM, September 23, 2017 Ocie Bob, MPT Physical Therapist with [facility] [phone] office [phone] mobile phone

## 2017-09-01 NOTE — Progress Notes (Signed)
PROGRESS NOTE    Dustin Fuller  RDE:[id number] DOB: [birth year] DOA: 08/31/2017 PCP: Celene Squibb, MD    Brief Narrative:  57 year old male admitted to the hospital with right facial droop/numbness.  Initial concern for underlying CVA, but MRI is been negative for infarct.  He is undergoing further work-up.  Neurology following.   Assessment & Plan:   Active Problems:   Facial droop   Tobacco abuse   COPD (chronic obstructive pulmonary disease) (HCC)   HTN (hypertension)   1. Right facial droop/numbness.  Etiology is unclear.  MRI/MRI did not reveal any evidence of acute infarct.  MRI brain with contrast did not reveal any findings regarding cranial nerve VII or VIII.  ESR noted to be normal range.  Neurology has seen the patient for the work-up under progress. 2. COPD.  No evidence of wheezing.  Continue bronchodilators 3. Hyperlipidemia.  LDL elevated.  He is been started on a statin. 4. Hypertension.  Resume home antihypertensives of nifedipine, labetalol and losartan 5. Tobacco use.  Nicotine patch   DVT prophylaxis: Lovenox Code Status: Full code Family Communication: No family present Disposition Plan: Observation, telemetry   Consultants:   Neurology  Procedures:   Echo  Antimicrobials:      Subjective: Continues to feel numbness on the right side of his face.  Complains of paresthesias in his right upper and forearm.  This does not involve his hand.  Objective: Vitals:   09/01/17 0804 09/01/17 1000 09/01/17 1400 09/01/17 1822  BP:  (!) 152/92 (!) 155/85 (!) 159/98  Pulse:  78 75 82  Resp:  19 (!) 21 19  Temp:  97.9 F (36.6 C) 98.3 F (36.8 C) 98.4 F (36.9 C)  TempSrc:  Oral Oral Oral  SpO2: 98% 99% 100% 100%  Weight:      Height:        Intake/Output Summary (Last 24 hours) at 09/01/2017 1855 Last data filed at 09/01/2017 1300 Gross per 24 hour  Intake 1113.75 ml  Output 150 ml  Net 963.75 ml   Filed Weights   08/31/17 1841  Weight:  98.3 kg (216 lb 12.8 oz)    Examination:  General exam: Appears calm and comfortable  Respiratory system: Clear to auscultation. Respiratory effort normal. Cardiovascular system: S1 & S2 heard, RRR. No JVD, murmurs, rubs, gallops or clicks. No pedal edema. Gastrointestinal system: Abdomen is nondistended, soft and nontender. No organomegaly or masses felt. Normal bowel sounds heard. Central nervous system: Alert and oriented. No focal neurological deficits. Extremities: Symmetric 5 x 5 power. Skin: No rashes, lesions or ulcers Psychiatry: Judgement and insight appear normal. Mood & affect appropriate.     Data Reviewed: I have personally reviewed following labs and imaging studies  CBC: Recent Labs  Lab 08/31/17 1313  WBC 6.3  NEUTROABS 3.7  HGB 15.0  HCT 43.7  MCV 92.2  PLT 631   Basic Metabolic Panel: Recent Labs  Lab 08/31/17 1313  NA 139  K 3.8  CL 105  CO2 27  GLUCOSE 92  BUN 17  CREATININE 0.90  CALCIUM 9.5   GFR: Estimated Creatinine Clearance: 108.2 mL/min (by C-G formula based on SCr of 0.9 mg/dL). Liver Function Tests: No results for input(s): AST, ALT, ALKPHOS, BILITOT, PROT, ALBUMIN in the last 168 hours. No results for input(s): LIPASE, AMYLASE in the last 168 hours. No results for input(s): AMMONIA in the last 168 hours. Coagulation Profile: Recent Labs  Lab 08/31/17 1313  INR 1.06   Cardiac  Enzymes: No results for input(s): CKTOTAL, CKMB, CKMBINDEX, TROPONINI in the last 168 hours. BNP (last 3 results) No results for input(s): PROBNP in the last 8760 hours. HbA1C: Recent Labs    09/01/17 0523  HGBA1C 6.1*   CBG: No results for input(s): GLUCAP in the last 168 hours. Lipid Profile: Recent Labs    09/01/17 0523  CHOL 206*  HDL 56  LDLCALC 135*  TRIG 75  CHOLHDL 3.7   Thyroid Function Tests: Recent Labs    09/01/17 1149  TSH 0.551   Anemia Panel: No results for input(s): VITAMINB12, FOLATE, FERRITIN, TIBC, IRON, RETICCTPCT  in the last 72 hours. Sepsis Labs: No results for input(s): PROCALCITON, LATICACIDVEN in the last 168 hours.  No results found for this or any previous visit (from the past 240 hour(s)).       Radiology Studies: Ct Head Wo Contrast  Result Date: 08/31/2017 CLINICAL DATA:  Slurred speech beginning yesterday. EXAM: CT HEAD WITHOUT CONTRAST TECHNIQUE: Contiguous axial images were obtained from the base of the skull through the vertex without intravenous contrast. COMPARISON:  None. FINDINGS: Brain: The brain shows a normal appearance without evidence of malformation, atrophy, old or acute small or large vessel infarction, mass lesion, hemorrhage, hydrocephalus or extra-axial collection. Vascular: There is atherosclerotic calcification of the major vessels at the base of the brain. Skull: Normal.  No traumatic finding.  No focal bone lesion. Sinuses/Orbits: Sinuses are clear. Orbits appear normal. Mastoids are clear. Other: None significant IMPRESSION: Normal head CT with exception of considerable atherosclerotic calcification of the major vessels at the base of the brain. Electronically Signed   By: Nelson Chimes M.D.   On: 08/31/2017 13:56   Mr Brain Wo Contrast  Result Date: 08/31/2017 CLINICAL DATA:  57 y/o  M; right facial droop and slurred speech. EXAM: MRI HEAD WITHOUT CONTRAST MRA HEAD WITHOUT CONTRAST TECHNIQUE: Multiplanar, multiecho pulse sequences of the brain and surrounding structures were obtained without intravenous contrast. Angiographic images of the head were obtained using MRA technique without contrast. COMPARISON:  08/31/2017 CT head. FINDINGS: MRI HEAD FINDINGS Brain: No acute infarction, hemorrhage, hydrocephalus, extra-axial collection or mass lesion. Several nonspecific foci of T2 FLAIR hyperintense signal abnormality in periventricular white matter are compatible with mild chronic microvascular ischemic changes for age. Vascular: As below. Skull and upper cervical spine: Normal  marrow signal. Sinuses/Orbits: Negative. Other: None. MRA HEAD FINDINGS Anterior circulation: No large vessel occlusion, aneurysm, or significant stenosis is identified. Posterior circulation: No large vessel occlusion, aneurysm, or significant stenosis is identified. Anatomic variant: No communicating artery identified, likely hypoplastic or absent. IMPRESSION: 1. No acute intracranial abnormality identified. 2. Mild chronic microvascular ischemic changes of the brain. 3. Normal MRA of the head. Electronically Signed   By: Kristine Garbe M.D.   On: 08/31/2017 18:58   Mr Brain W Contrast  Result Date: 09/01/2017 CLINICAL DATA:  Bell's palsy EXAM: MRI HEAD WITH CONTRAST TECHNIQUE: Multiplanar, multiecho pulse sequences of the brain and surrounding structures were obtained with intravenous contrast. CONTRAST:  72m MULTIHANCE GADOBENATE DIMEGLUMINE 529 MG/ML IV SOLN COMPARISON:  MRI head without contrast 08/31/2017 FINDINGS: Postcontrast imaging of the brain demonstrates normal enhancement. No enhancing mass lesion. Leptomeningeal enhancement normal. Seventh and eighth cranial nerves not show any abnormal enhancement. Normal brainstem. Ventricle size normal.  No mass-effect or edema. Mastoid sinus normal bilaterally. Normal dural venous sinus enhancement. Normal pituitary. IMPRESSION: Normal enhancement of the brain. Negative for mass lesion. Normal enhancement of the seventh and eighth cranial nerves.  Electronically Signed   By: Franchot Gallo M.D.   On: 09/01/2017 10:50   US Carotid Bilateral (at [facility] Only)  Result Date: 09/01/2017 CLINICAL DATA:  Stroke EXAM: BILATERAL CAROTID DUPLEX ULTRASOUND TECHNIQUE: Pearline Cables scale imaging, color Doppler and duplex ultrasound were performed of bilateral carotid and vertebral arteries in the neck. COMPARISON:  None. FINDINGS: Criteria: Quantification of carotid stenosis is based on velocity parameters that correlate the residual internal carotid diameter  with NASCET-based stenosis levels, using the diameter of the distal internal carotid lumen as the denominator for stenosis measurement. The following velocity measurements were obtained: RIGHT ICA:  80 cm/sec CCA:  58 cm/sec SYSTOLIC ICA/CCA RATIO:  1.4 DIASTOLIC ICA/CCA RATIO: ECA:  42 cm/sec LEFT ICA:  68 cm/sec CCA:  69 cm/sec SYSTOLIC ICA/CCA RATIO:  1.0 DIASTOLIC ICA/CCA RATIO: ECA:  49 cm/sec RIGHT CAROTID ARTERY: There is moderate calcified plaque in the bulb. Low resistance internal carotid Doppler pattern is preserved. RIGHT VERTEBRAL ARTERY:  Antegrade. LEFT CAROTID ARTERY: Minimal calcified plaque in the mid common carotid artery. There is moderate calcified plaque in the bulb. Low resistance internal carotid Doppler pattern is preserved. LEFT VERTEBRAL ARTERY:  Antegrade. IMPRESSION: Less than 50% stenosis in the right and left internal carotid arteries. Electronically Signed   By: Marybelle Killings M.D.   On: 09/01/2017 11:28   Mr Jodene Nam Head/brain GL Cm  Result Date: 08/31/2017 CLINICAL DATA:  57 y/o  M; right facial droop and slurred speech. EXAM: MRI HEAD WITHOUT CONTRAST MRA HEAD WITHOUT CONTRAST TECHNIQUE: Multiplanar, multiecho pulse sequences of the brain and surrounding structures were obtained without intravenous contrast. Angiographic images of the head were obtained using MRA technique without contrast. COMPARISON:  08/31/2017 CT head. FINDINGS: MRI HEAD FINDINGS Brain: No acute infarction, hemorrhage, hydrocephalus, extra-axial collection or mass lesion. Several nonspecific foci of T2 FLAIR hyperintense signal abnormality in periventricular white matter are compatible with mild chronic microvascular ischemic changes for age. Vascular: As below. Skull and upper cervical spine: Normal marrow signal. Sinuses/Orbits: Negative. Other: None. MRA HEAD FINDINGS Anterior circulation: No large vessel occlusion, aneurysm, or significant stenosis is identified. Posterior circulation: No large vessel  occlusion, aneurysm, or significant stenosis is identified. Anatomic variant: No communicating artery identified, likely hypoplastic or absent. IMPRESSION: 1. No acute intracranial abnormality identified. 2. Mild chronic microvascular ischemic changes of the brain. 3. Normal MRA of the head. Electronically Signed   By: Kristine Garbe M.D.   On: 08/31/2017 18:58        Scheduled Meds: .  stroke: mapping our early stages of recovery book   Does not apply Once  . atorvastatin  40 mg Oral q1800  . brimonidine  1 drop Both Eyes TID  . dorzolamide-timolol  1 drop Both Eyes BID  . enoxaparin (LOVENOX) injection  40 mg Subcutaneous Q24H  . furosemide  40 mg Oral BID  . labetalol  200 mg Oral BID  . latanoprost  1 drop Both Eyes QHS  . losartan  100 mg Oral Daily  . mometasone-formoterol  2 puff Inhalation BID  . nicotine  14 mg Transdermal Daily  . NIFEdipine  30 mg Oral Daily  . prednisoLONE acetate  1 drop Both Eyes BID  . sertraline  50 mg Oral Daily   Continuous Infusions: . sodium chloride 75 mL/hr at 08/31/17 1933     LOS: 0 days    Time spent: 30 minutes    Kathie Dike, MD [facility] Pager [phone]  If 7PM-7AM, please contact  night-coverage [email or web address] Password [facility] 09/01/2017, 6:55 PM

## 2017-09-01 NOTE — Progress Notes (Signed)
SLP Cancellation Note  Patient Details Name: Fransisca ConnorsMichael Stamos MRN: [id number] DOB: 02/05/1961   Cancelled treatment:       Reason Eval/Treat Not Completed: SLP screened, no needs identified, will sign off; MRI negative for acute changes.  Thank you,  Havery MorosDabney Porter, CCC-SLP [phone]  PORTER,DABNEY 09/01/2017, 3:46 PM

## 2017-09-01 NOTE — Consult Note (Signed)
Dustin A. Merlene Laughter, MD     [email or web address]          Dustin Fuller is an 57 y.o. male.   ASSESSMENT/PLAN: 1. RIGHT FACIAL SPASMS DYSESTHESIA AND NUMBNESS:  The negative imaging and the history of Bell's palsy suggest chronic and recurrent meningitis.  Given the negative imaging, ischemia is likely not the etiology.  Additional workup will include MRI with contrast.  Additional labs will be obtained including RPR, C-reactive protein, ANA and sed rate along with HIV.  A spinal tap is recommended but given that he is on dual antiplatelet agent, we will have to wait for this to be done.  Probably can do this in outpatient setting.  An EEG will also be done.  If the workup is negative, I think we should consider 1 time dose of steroids such as Solu-Medrol 500 mg.     The patient is a 57 year old black male who presents with the acute onset of abnormal sensation involving the right facial region.  He reports disease seizure and a pulling like sensation.  Additionally, he reports having some numbness of the low right upper extremity and right leg.  Epicenter however is seems to be the right facial region.  He reports also having headaches with this event.  Does not report having significant visual changes, dysphagia or dysarthria.  The patient tells me that he has had a stroke in the past although he could not tell me if he had symptoms with this.  He tells me he was told that he does had a stroke and the stroke may have been asymptomatic.  He also has had Bell's palsy with severe right facial weakness which resolved.  This was a few years ago.  The patient still continues to have right facial symptoms.  The review of systems otherwise negative.     GENERAL:   This is a pleasant male who is in no acute distress.  HEENT:     There is mild right ptosis.  Neck is supple.  No trauma appreciated.  ABDOMEN: soft  EXTREMITIES: No edema   BACK:  This is normal.  SKIN:  Normal by inspection.    MENTAL STATUS: Alert and oriented. Speech, language and cognition are generally intact. Judgment and insight normal.   CRANIAL NERVES: Pupils are equal, round and reactive to light and accomodation; extra ocular movements are full, there is no significant nystagmus; visual fields are full;   There is mild weakness of the entire right facial muscles associated with mild right ptosis and mild flattening of the nasal labial fold on the right, tongue is midline; uvula is midline; shoulder elevation is normal.  MOTOR: Normal tone, bulk and strength; no pronator drift.  COORDINATION: Left finger to nose is normal, right finger to nose is normal, No rest tremor; no intention tremor; no postural tremor; no bradykinesia.  REFLEXES: Deep tendon reflexes are symmetrical and normal. Plantar reflexes are flexor bilaterally.   SENSATION: Normal to light touch, temperature, and pain.         H/P: Dustin Fuller is a 57 y.o. male with medical history significant of atrial fibrillation in the past, status post ablation currently in sinus rhythm, history of stroke in the past, COPD, Bell's palsy, hypertension, ongoing tobacco use presents to the hospital with right facial numbness and weakness.  Patient reports that for the past 1 to 2 days, he has been experiencing right facial numbness, occasional paresthesias and some weakness.  He transiently noted that  his speech was slurred, but this is resolved.  He is also noted numbness and paresthesias in his right arm.  To a lesser degree in his right leg.  He is not had any difficulty walking or trouble with balance.  He has recently had eye surgery, but has not noticed any abrupt changes in vision in the last 1 to 2 days.  No fever, cough, vomiting, diarrhea.     Blood pressure 128/76, pulse 78, temperature 98.4 F (36.9 C), temperature source Oral, resp. rate 18, height 5' 11"  (1.803 m), weight 216 lb 12.8 oz (98.3 kg), SpO2 98  %.  Past Medical History:  Diagnosis Date  . Stroke [facility])     History reviewed. No pertinent surgical history.  History reviewed. No pertinent family history.  Social History:  reports that he has been smoking cigarettes.  He has been smoking about 0.25 packs per day. He has never used smokeless tobacco. He reports that he drank alcohol. He reports that he has current or past drug history.  Allergies: Not on File  Medications: Prior to Admission medications   Medication Sig Start Date End Date Taking? Authorizing Provider  albuterol (PROVENTIL HFA) 108 (90 Base) MCG/ACT inhaler Inhale 2 puffs into the lungs every 4 (four) hours as needed. 06/16/17  Yes [provider]  ALPHAGAN P 0.1 % SOLN Place 1 drop into both eyes. 08/15/17  Yes [provider]  aspirin 81 MG EC tablet Take 1 tablet by mouth daily. 06/17/17 06/17/18 Yes [provider]  diclofenac (VOLTAREN) 75 MG EC tablet Take 75 mg by mouth 2 (two) times daily. for 10 days 08/06/17  Yes [provider]  dorzolamide-timolol (COSOPT) 22.3-6.8 MG/ML ophthalmic solution Place 1 drop into both eyes 2 (two) times daily. 08/26/17  Yes [provider]  furosemide (LASIX) 40 MG tablet Take 40 mg by mouth 2 (two) times daily. 08/15/17  Yes [provider]  labetalol (NORMODYNE) 200 MG tablet Take 200 mg by mouth 2 (two) times daily. 08/15/17  Yes [provider]  latanoprost (XALATAN) 0.005 % ophthalmic solution Place 1 drop into both eyes at bedtime. 08/15/17  Yes [provider]  losartan (COZAAR) 100 MG tablet Take 100 mg by mouth daily. 08/15/17  Yes [provider]  NIFEdipine (PROCARDIA-XL/ADALAT-CC/NIFEDICAL-XL) 30 MG 24 hr tablet Take 30 mg by mouth daily. 08/15/17  Yes [provider]  prednisoLONE acetate (PRED FORTE) 1 % ophthalmic suspension Place 1 drop into both eyes 2 (two) times daily. 08/15/17  Yes [provider]  sertraline (ZOLOFT) 50  MG tablet Take 50 mg by mouth daily. 07/19/17  Yes [provider]  SYMBICORT 80-4.5 MCG/ACT inhaler Inhale 2 puffs into the lungs 2 (two) times daily. 08/15/17  Yes [provider]    Scheduled Meds: .  stroke: mapping our early stages of recovery book   Does not apply Once  . aspirin  300 mg Rectal Daily   Or  . aspirin  325 mg Oral Daily  . atorvastatin  40 mg Oral q1800  . brimonidine  1 drop Both Eyes TID  . clopidogrel  75 mg Oral Daily  . dorzolamide-timolol  1 drop Both Eyes BID  . enoxaparin (LOVENOX) injection  40 mg Subcutaneous Q24H  . furosemide  40 mg Oral BID  . latanoprost  1 drop Both Eyes QHS  . mometasone-formoterol  2 puff Inhalation BID  . nicotine  14 mg Transdermal Daily  . prednisoLONE acetate  1 drop  Both Eyes BID  . sertraline  50 mg Oral Daily   Continuous Infusions: . sodium chloride 75 mL/hr at 08/31/17 1933   PRN Meds:.acetaminophen **OR** acetaminophen (TYLENOL) oral liquid 160 mg/5 mL **OR** acetaminophen     Results for orders placed or performed during the hospital encounter of 08/31/17 (from the past 48 hour(s))  CBC with Differential     Status: None   Collection Time: 08/31/17  1:13 PM  Result Value Ref Range   WBC 6.3 4.0 - 10.5 K/uL   RBC 4.74 4.22 - 5.81 MIL/uL   Hemoglobin 15.0 13.0 - 17.0 g/dL   HCT 43.7 39.0 - 52.0 %   MCV 92.2 78.0 - 100.0 fL   MCH 31.6 26.0 - 34.0 pg   MCHC 34.3 30.0 - 36.0 g/dL   RDW 13.4 11.5 - 15.5 %   Platelets 193 150 - 400 K/uL   Neutrophils Relative % 59 %   Neutro Abs 3.7 1.7 - 7.7 K/uL   Lymphocytes Relative 29 %   Lymphs Abs 1.8 0.7 - 4.0 K/uL   Monocytes Relative 9 %   Monocytes Absolute 0.6 0.1 - 1.0 K/uL   Eosinophils Relative 3 %   Eosinophils Absolute 0.2 0.0 - 0.7 K/uL   Basophils Relative 0 %   Basophils Absolute 0.0 0.0 - 0.1 K/uL    Comment: Performed at [facility], [address], [place], [place] [address]  Basic metabolic panel     Status: None   Collection Time:  08/31/17  1:13 PM  Result Value Ref Range   Sodium 139 135 - 145 mmol/L   Potassium 3.8 3.5 - 5.1 mmol/L   Chloride 105 98 - 111 mmol/L    Comment: Please note change in reference range.   CO2 27 22 - 32 mmol/L   Glucose, Bld 92 70 - 99 mg/dL    Comment: Please note change in reference range.   BUN 17 6 - 20 mg/dL    Comment: Please note change in reference range.   Creatinine, Ser 0.90 0.61 - 1.24 mg/dL   Calcium 9.5 8.9 - 10.3 mg/dL   GFR calc non Af Amer >60 >60 mL/min   GFR calc Af Amer >60 >60 mL/min    Comment: (NOTE) The eGFR has been calculated using the CKD EPI equation. This calculation has not been validated in all clinical situations. eGFR's persistently <60 mL/min signify possible Chronic Kidney Disease.    Anion gap 7 5 - 15    Comment: Performed at [facility], [address] [place], [place] [address]  Urinalysis, Routine w reflex microscopic     Status: None   Collection Time: 08/31/17  1:13 PM  Result Value Ref Range   Color, Urine YELLOW YELLOW   APPearance CLEAR CLEAR   Specific Gravity, Urine 1.018 1.005 - 1.030   pH 5.0 5.0 - 8.0   Glucose, UA NEGATIVE NEGATIVE mg/dL   Hgb urine dipstick NEGATIVE NEGATIVE   Bilirubin Urine NEGATIVE NEGATIVE   Ketones, ur NEGATIVE NEGATIVE mg/dL   Protein, ur NEGATIVE NEGATIVE mg/dL   Nitrite NEGATIVE NEGATIVE   Leukocytes, UA NEGATIVE NEGATIVE    Comment: Performed at [facility], [address] [place], [place] [address]  APTT     Status: None   Collection Time: 08/31/17  1:13 PM  Result Value Ref Range   aPTT 29 24 - 36 seconds    Comment: Performed at [facility], [address] [place],  [address]  Protime-INR  Status: None   Collection Time: 08/31/17  1:13 PM  Result Value Ref Range   Prothrombin Time 13.7 11.4 - 15.2 seconds   INR 1.06     Comment: Performed at [facility], [address], [place], [place] [address]  Lipid panel     Status: Abnormal   Collection Time: 09/01/17  5:23 AM    Result Value Ref Range   Cholesterol 206 (H) 0 - 200 mg/dL   Triglycerides 75 <150 mg/dL   HDL 56 >40 mg/dL   Total CHOL/HDL Ratio 3.7 RATIO   VLDL 15 0 - 40 mg/dL   LDL Cholesterol 135 (H) 0 - 99 mg/dL    Comment:        Total Cholesterol/HDL:CHD Risk Coronary Heart Disease Risk Table                     Men   Women  1/2 Average Risk   3.4   3.3  Average Risk       5.0   4.4  2 X Average Risk   9.6   7.1  3 X Average Risk  23.4   11.0        Use the calculated Patient Ratio above and the CHD Risk Table to determine the patient's CHD Risk.        ATP III CLASSIFICATION (LDL):  <100     mg/dL   Optimal  100-129  mg/dL   Near or Above                    Optimal  130-159  mg/dL   Borderline  160-189  mg/dL   High  >190     mg/dL   Very High Performed at [facility], [address] [place], [place] [address]     Studies/Results: BRAIN MRI MRA FINDINGS: MRI HEAD FINDINGS  Brain: No acute infarction, hemorrhage, hydrocephalus, extra-axial collection or mass lesion. Several nonspecific foci of T2 FLAIR hyperintense signal abnormality in periventricular white matter are compatible with mild chronic microvascular ischemic changes for age.  Vascular: As below.  Skull and upper cervical spine: Normal marrow signal.  Sinuses/Orbits: Negative.  Other: None.  MRA HEAD FINDINGS  Anterior circulation: No large vessel occlusion, aneurysm, or significant stenosis is identified.  Posterior circulation: No large vessel occlusion, aneurysm, or significant stenosis is identified.  Anatomic variant: No communicating artery identified, likely hypoplastic or absent.  IMPRESSION: 1. No acute intracranial abnormality identified. 2. Mild chronic microvascular ischemic changes of the brain. 3. Normal MRA of the head.    The brain MRI scan is reviewed in person.  The MRA is also reviewed.  No acute changes are seen on DWI.  SWI signal hemorrhage.  There is mild to  moderate periventricular signal seen on FLAIR imaging.  MRA shows no intracranial occlusive disease of any significance.    Zurri Rudden A. Merlene Fuller, M.D.  Diplomate, Tax adviser of Psychiatry and Neurology ( Neurology). 09/01/2017, 8:47 AM

## 2017-09-01 NOTE — Progress Notes (Signed)
*  PRELIMINARY RESULTS* Echocardiogram 2D Echocardiogram has been performed.  Jeryl Columbialliott, Amit Leece 09/01/2017, 2:44 PM

## 2017-09-01 NOTE — Progress Notes (Signed)
Attempted EEG (tech came from [facility]) Pt has MRI plus other testing. Pt is not available for a while. Will attempt at a different time when schedule permits

## 2017-09-01 NOTE — Plan of Care (Signed)
  Problem: Education: Goal: Knowledge of secondary prevention will improve Outcome: Progressing Goal: Knowledge of patient specific risk factors addressed and post discharge goals established will improve Outcome: Progressing   Problem: Health Behavior/Discharge Planning: Goal: Ability to manage health-related needs will improve Outcome: Progressing   Problem: Self-Care: Goal: Ability to communicate needs accurately will improve Outcome: Progressing

## 2017-09-01 NOTE — Progress Notes (Signed)
OT Cancellation Note  Patient Details Name: Dustin Fuller MRN: [id number] DOB: 01/25/1961   Cancelled Treatment:    Reason Eval/Treat Not Completed: OT screened, no needs identified, will sign off. Pt reporting symptoms are resolving, continues to have some tingling in the RUE. Pt strength is 5/5, coordination and sensation are intact. Pt is using RUE to operate remote without difficulty, performing bed mobility independently. Pt reports no difficulty getting up and going to bathroom overnight. Pt is at baseline with ADL completion, no further OT services required at this time.    Ezra SitesLeslie Viral Schramm, OTR/L  [phone] 09/01/2017, 7:33 AM

## 2017-09-02 ENCOUNTER — Observation Stay (HOSPITAL_COMMUNITY)
Admit: 2017-09-02 | Discharge: 2017-09-02 | Disposition: A | Discharge status: Home / Self Care | Payer: Medicaid Other | Attending: Neurology | Admitting: Neurology

## 2017-09-02 DIAGNOSIS — R29898 Other symptoms and signs involving the musculoskeletal system: Secondary | ICD-10-CM | POA: Diagnosis not present

## 2017-09-02 DIAGNOSIS — J449 Chronic obstructive pulmonary disease, unspecified: Secondary | ICD-10-CM | POA: Diagnosis not present

## 2017-09-02 DIAGNOSIS — I10 Essential (primary) hypertension: Secondary | ICD-10-CM | POA: Diagnosis not present

## 2017-09-02 DIAGNOSIS — R29810 Facial weakness: Secondary | ICD-10-CM | POA: Diagnosis not present

## 2017-09-02 LAB — RPR: RPR: NONREACTIVE

## 2017-09-02 LAB — VITAMIN B12: VITAMIN B 12: 301 pg/mL (ref 180–914)

## 2017-09-02 LAB — HOMOCYSTEINE: HOMOCYSTEINE-NORM: 8.4 umol/L (ref 0.0–15.0)

## 2017-09-02 LAB — HIV ANTIBODY (ROUTINE TESTING W REFLEX): HIV Screen 4th Generation wRfx: NONREACTIVE

## 2017-09-02 MED ORDER — ATORVASTATIN CALCIUM 20 MG PO TABS: 40.0000 mg | ORAL_TABLET | Freq: Every day | ORAL | 0 refills | Status: DC

## 2017-09-02 MED ORDER — METHOCARBAMOL 500 MG PO TABS: 500.0000 mg | ORAL_TABLET | Freq: Four times a day (QID) | ORAL | 0 refills | Status: DC | PRN

## 2017-09-02 MED ORDER — SODIUM CHLORIDE 0.9 % IV SOLN
500.0000 mg | Freq: Once | INTRAVENOUS | Status: AC
  Administered 2017-09-02: 500 mg via INTRAVENOUS
  Filled 2017-09-02: qty 4

## 2017-09-02 NOTE — Progress Notes (Signed)
Discharge instructions read to patient.  Patient verbalized understanding of all instructions. [facility].

## 2017-09-02 NOTE — Discharge Summary (Signed)
Physician Discharge Summary  Dustin Fuller POE:[id number] DOB: 06-Oct-1960 DOA: 08/31/2017  PCP: Celene Squibb, MD  Admit date: 08/31/2017 Discharge date: 09/02/2017  Admitted From: home Disposition:  home  Recommendations for Outpatient Follow-up:  1. Follow up with PCP in 1-2 weeks 2. Please obtain BMP/CBC in one week 3. Follow up with Dr. Merlene Laughter in 2 weeks  Discharge Condition: stable CODE STATUS:full code Diet recommendation: heart healthy  Brief/Interim Summary: 57 year old male with a history of COPD, hypertension, hyperlipidemia, Bell's palsy and tobacco use, presents to the hospital with right facial droop/numbness.  He also complained of paresthesias in his right arm.  Initial concern was for underlying CVA.  He underwent work-up, but MRI brain did not show any evidence of infarct.  Furthermore, I did not show any involvement of cranial nerve VII or VIII.  ESR was noted to be normal.  He was seen by neurology and underwent EEG.  This was also a normal study.  Further recommendations from neurology were for lumbar puncture.  Unfortunately, since he has received Plavix in the hospital, this cannot be performed for another 5 days.  Patient will follow-up with neurology in 2 weeks to undergo lumbar puncture.  Dr. Merlene Laughter has recommended Robaxin 500 mg p.o. every 6 hours as needed.  The remainder of his medical problems remained stable in the hospital.  Discharge Diagnoses:  Active Problems:   Facial droop   Tobacco abuse   COPD (chronic obstructive pulmonary disease) (HCC)   HTN (hypertension)    Discharge Instructions  Discharge Instructions    Diet - low sodium heart healthy   Complete by:  As directed    Increase activity slowly   Complete by:  As directed      Allergies as of 09/02/2017      Reactions   Lisinopril       Medication List    STOP taking these medications   sertraline 50 MG tablet Commonly known as:  ZOLOFT     TAKE these medications   ALPHAGAN  P 0.1 % Soln Generic drug:  brimonidine Place 1 drop into both eyes.   aspirin 81 MG EC tablet Take 1 tablet by mouth daily.   atorvastatin 20 MG tablet Commonly known as:  LIPITOR Take 2 tablets (40 mg total) by mouth daily at 6 PM.   diclofenac 75 MG EC tablet Commonly known as:  VOLTAREN Take 75 mg by mouth 2 (two) times daily. for 10 days   dorzolamide-timolol 22.3-6.8 MG/ML ophthalmic solution Commonly known as:  COSOPT Place 1 drop into both eyes 2 (two) times daily.   furosemide 40 MG tablet Commonly known as:  LASIX Take 40 mg by mouth 2 (two) times daily.   labetalol 200 MG tablet Commonly known as:  NORMODYNE Take 200 mg by mouth 2 (two) times daily.   latanoprost 0.005 % ophthalmic solution Commonly known as:  XALATAN Place 1 drop into both eyes at bedtime.   losartan 100 MG tablet Commonly known as:  COZAAR Take 100 mg by mouth daily.   methocarbamol 500 MG tablet Commonly known as:  ROBAXIN Take 1 tablet (500 mg total) by mouth every 6 (six) hours as needed for muscle spasms.   NIFEdipine 30 MG 24 hr tablet Commonly known as:  PROCARDIA-XL/ADALAT-CC/NIFEDICAL-XL Take 30 mg by mouth daily.   prednisoLONE acetate 1 % ophthalmic suspension Commonly known as:  PRED FORTE Place 1 drop into both eyes 2 (two) times daily.   PROVENTIL HFA 108 (90 Base)  MCG/ACT inhaler Generic drug:  albuterol Inhale 2 puffs into the lungs every 4 (four) hours as needed.   SYMBICORT 80-4.5 MCG/ACT inhaler Generic drug:  budesonide-formoterol Inhale 2 puffs into the lungs 2 (two) times daily.      Follow-up Information    Phillips Odor, MD. Schedule an appointment as soon as possible for a visit in 2 week(s).   Specialty:  Neurology Contact information: [address] [place] [address] [phone]          Allergies  Allergen Reactions  . Lisinopril     Consultations:  Neurology   Procedures/Studies: Dg Elbow Complete Right  (3+view)  Result Date: 08/07/2017 CLINICAL DATA:  Right elbow pain.  No known injury. EXAM: RIGHT ELBOW - COMPLETE 3+ VIEW COMPARISON:  None. FINDINGS: There is no evidence of fracture, dislocation, or joint effusion. There is no evidence of arthropathy or other focal bone abnormality. Soft tissues are unremarkable. IMPRESSION: Negative. Electronically Signed   By: Rolm Baptise M.D.   On: 08/07/2017 10:58   Dg Forearm Right  Result Date: 08/07/2017 CLINICAL DATA:  Right elbow and forearm pain, swelling. No known injury. EXAM: RIGHT FOREARM - 2 VIEW COMPARISON:  Elbow series performed today. FINDINGS: There is no evidence of fracture or other focal bone lesions. Soft tissues are unremarkable. IMPRESSION: Negative. Electronically Signed   By: Rolm Baptise M.D.   On: 08/07/2017 10:59   Ct Head Wo Contrast  Result Date: 08/31/2017 CLINICAL DATA:  Slurred speech beginning yesterday. EXAM: CT HEAD WITHOUT CONTRAST TECHNIQUE: Contiguous axial images were obtained from the base of the skull through the vertex without intravenous contrast. COMPARISON:  None. FINDINGS: Brain: The brain shows a normal appearance without evidence of malformation, atrophy, old or acute small or large vessel infarction, mass lesion, hemorrhage, hydrocephalus or extra-axial collection. Vascular: There is atherosclerotic calcification of the major vessels at the base of the brain. Skull: Normal.  No traumatic finding.  No focal bone lesion. Sinuses/Orbits: Sinuses are clear. Orbits appear normal. Mastoids are clear. Other: None significant IMPRESSION: Normal head CT with exception of considerable atherosclerotic calcification of the major vessels at the base of the brain. Electronically Signed   By: Nelson Chimes M.D.   On: 08/31/2017 13:56   Mr Brain Wo Contrast  Result Date: 08/31/2017 CLINICAL DATA:  57 y/o  M; right facial droop and slurred speech. EXAM: MRI HEAD WITHOUT CONTRAST MRA HEAD WITHOUT CONTRAST TECHNIQUE: Multiplanar,  multiecho pulse sequences of the brain and surrounding structures were obtained without intravenous contrast. Angiographic images of the head were obtained using MRA technique without contrast. COMPARISON:  08/31/2017 CT head. FINDINGS: MRI HEAD FINDINGS Brain: No acute infarction, hemorrhage, hydrocephalus, extra-axial collection or mass lesion. Several nonspecific foci of T2 FLAIR hyperintense signal abnormality in periventricular white matter are compatible with mild chronic microvascular ischemic changes for age. Vascular: As below. Skull and upper cervical spine: Normal marrow signal. Sinuses/Orbits: Negative. Other: None. MRA HEAD FINDINGS Anterior circulation: No large vessel occlusion, aneurysm, or significant stenosis is identified. Posterior circulation: No large vessel occlusion, aneurysm, or significant stenosis is identified. Anatomic variant: No communicating artery identified, likely hypoplastic or absent. IMPRESSION: 1. No acute intracranial abnormality identified. 2. Mild chronic microvascular ischemic changes of the brain. 3. Normal MRA of the head. Electronically Signed   By: Kristine Garbe M.D.   On: 08/31/2017 18:58   Mr Brain W Contrast  Result Date: 09/01/2017 CLINICAL DATA:  Bell's palsy EXAM: MRI HEAD WITH CONTRAST TECHNIQUE: Multiplanar,  multiecho pulse sequences of the brain and surrounding structures were obtained with intravenous contrast. CONTRAST:  59m MULTIHANCE GADOBENATE DIMEGLUMINE 529 MG/ML IV SOLN COMPARISON:  MRI head without contrast 08/31/2017 FINDINGS: Postcontrast imaging of the brain demonstrates normal enhancement. No enhancing mass lesion. Leptomeningeal enhancement normal. Seventh and eighth cranial nerves not show any abnormal enhancement. Normal brainstem. Ventricle size normal.  No mass-effect or edema. Mastoid sinus normal bilaterally. Normal dural venous sinus enhancement. Normal pituitary. IMPRESSION: Normal enhancement of the brain. Negative for  mass lesion. Normal enhancement of the seventh and eighth cranial nerves. Electronically Signed   By: CFranchot GalloM.D.   On: 09/01/2017 10:50   [place] (at [facility])  Result Date: 09/01/2017 CLINICAL DATA:  Stroke EXAM: BILATERAL CAROTID DUPLEX ULTRASOUND TECHNIQUE: GPearline Cablesscale imaging, color Doppler and duplex ultrasound were performed of bilateral carotid and vertebral arteries in the neck. COMPARISON:  None. FINDINGS: Criteria: Quantification of carotid stenosis is based on velocity parameters that correlate the residual internal carotid diameter with NASCET-based stenosis levels, using the diameter of the distal internal carotid lumen as the denominator for stenosis measurement. The following velocity measurements were obtained: RIGHT ICA:  80 cm/sec CCA:  58 cm/sec SYSTOLIC ICA/CCA RATIO:  1.4 DIASTOLIC ICA/CCA RATIO: ECA:  42 cm/sec LEFT ICA:  68 cm/sec CCA:  69 cm/sec SYSTOLIC ICA/CCA RATIO:  1.0 DIASTOLIC ICA/CCA RATIO: ECA:  49 cm/sec RIGHT CAROTID ARTERY: There is moderate calcified plaque in the bulb. Low resistance internal carotid Doppler pattern is preserved. RIGHT VERTEBRAL ARTERY:  Antegrade. LEFT CAROTID ARTERY: Minimal calcified plaque in the mid common carotid artery. There is moderate calcified plaque in the bulb. Low resistance internal carotid Doppler pattern is preserved. LEFT VERTEBRAL ARTERY:  Antegrade. IMPRESSION: Less than 50% stenosis in the right and left internal carotid arteries. Electronically Signed   By: AMarybelle KillingsM.D.   On: 09/01/2017 11:28   Mr MJodene NamHead/brain WCMCm  Result Date: 08/31/2017 CLINICAL DATA:  57y/o  M; right facial droop and slurred speech. EXAM: MRI HEAD WITHOUT CONTRAST MRA HEAD WITHOUT CONTRAST TECHNIQUE: Multiplanar, multiecho pulse sequences of the brain and surrounding structures were obtained without intravenous contrast. Angiographic images of the head were obtained using MRA technique without contrast. COMPARISON:   08/31/2017 CT head. FINDINGS: MRI HEAD FINDINGS Brain: No acute infarction, hemorrhage, hydrocephalus, extra-axial collection or mass lesion. Several nonspecific foci of T2 FLAIR hyperintense signal abnormality in periventricular white matter are compatible with mild chronic microvascular ischemic changes for age. Vascular: As below. Skull and upper cervical spine: Normal marrow signal. Sinuses/Orbits: Negative. Other: None. MRA HEAD FINDINGS Anterior circulation: No large vessel occlusion, aneurysm, or significant stenosis is identified. Posterior circulation: No large vessel occlusion, aneurysm, or significant stenosis is identified. Anatomic variant: No communicating artery identified, likely hypoplastic or absent. IMPRESSION: 1. No acute intracranial abnormality identified. 2. Mild chronic microvascular ischemic changes of the brain. 3. Normal MRA of the head. Electronically Signed   By: LKristine GarbeM.D.   On: 08/31/2017 18:58       Subjective: Still has some numbness on right side of face  Discharge Exam: Vitals:   09/02/17 1000 09/02/17 1403  BP: (!) 163/99 (!) 142/86  Pulse: 75 74  Resp: 18 18  Temp: 98.3 F (36.8 C) 98 F (36.7 C)  SpO2: 100% 96%   Vitals:   09/02/17 0601 09/02/17 0803 09/02/17 1000 09/02/17 1403  BP: (!) 147/93  (!) 163/99 (!) 142/86  Pulse: 70  75 74  Resp: 18  18 18   Temp: 98 F (36.7 C)  98.3 F (36.8 C) 98 F (36.7 C)  TempSrc: Oral  Oral Oral  SpO2: 100% 97% 100% 96%  Weight:      Height:        General: Pt is alert, awake, not in acute distress Cardiovascular: RRR, S1/S2 +, no rubs, no gallops Respiratory: CTA bilaterally, no wheezing, no rhonchi Abdominal: Soft, NT, ND, bowel sounds + Extremities: no edema, no cyanosis    The results of significant diagnostics from this hospitalization (including imaging, microbiology, ancillary and laboratory) are listed below for reference.     Microbiology: No results found for this or  any previous visit (from the past 240 hour(s)).   Labs: BNP (last 3 results) No results for input(s): BNP in the last 8760 hours. Basic Metabolic Panel: Recent Labs  Lab 08/31/17 1313  NA 139  K 3.8  CL 105  CO2 27  GLUCOSE 92  BUN 17  CREATININE 0.90  CALCIUM 9.5   Liver Function Tests: No results for input(s): AST, ALT, ALKPHOS, BILITOT, PROT, ALBUMIN in the last 168 hours. No results for input(s): LIPASE, AMYLASE in the last 168 hours. No results for input(s): AMMONIA in the last 168 hours. CBC: Recent Labs  Lab 08/31/17 1313  WBC 6.3  NEUTROABS 3.7  HGB 15.0  HCT 43.7  MCV 92.2  PLT 193   Cardiac Enzymes: No results for input(s): CKTOTAL, CKMB, CKMBINDEX, TROPONINI in the last 168 hours. BNP: Invalid input(s): POCBNP CBG: No results for input(s): GLUCAP in the last 168 hours. D-Dimer No results for input(s): DDIMER in the last 72 hours. Hgb A1c Recent Labs    09/01/17 0523  HGBA1C 6.1*   Lipid Profile Recent Labs    09/01/17 0523  CHOL 206*  HDL 56  LDLCALC 135*  TRIG 75  CHOLHDL 3.7   Thyroid function studies Recent Labs    09/01/17 1149  TSH 0.551   Anemia work up Recent Labs    09/01/17 1149  VITAMINB12 301   Urinalysis    Component Value Date/Time   COLORURINE YELLOW 08/31/2017 [place] 08/31/2017 1313   LABSPEC 1.018 08/31/2017 1313   [place] 5.0 08/31/2017 1313   GLUCOSEU NEGATIVE 08/31/2017 1313   HGBUR NEGATIVE 08/31/2017 1313   [place] NEGATIVE 08/31/2017 1313   KETONESUR NEGATIVE 08/31/2017 1313   PROTEINUR NEGATIVE 08/31/2017 1313   NITRITE NEGATIVE 08/31/2017 1313   LEUKOCYTESUR NEGATIVE 08/31/2017 1313   Sepsis Labs Invalid input(s): PROCALCITONIN,  WBC,  LACTICIDVEN Microbiology No results found for this or any previous visit (from the past 240 hour(s)).   Time coordinating discharge: 2mns  SIGNED:   JKathie Dike MD  [facility] 09/02/2017, 5:44 PM Pager   If 7PM-7AM,  please contact night-coverage [email or web address] Password [email or web address]

## 2017-09-02 NOTE — Procedures (Signed)
[facility] Dustin Olds A. Gerilyn Pilgrimoonquah, MD     [email or web address]           HISTORY: The patient is a 57 year old man who presents with recurrent right facial twitching worrisome for partial seizures.  MEDICATIONS: Scheduled Meds: .  stroke: mapping our early stages of recovery book   Does not apply Once  . atorvastatin  40 mg Oral q1800  . brimonidine  1 drop Both Eyes TID  . dorzolamide-timolol  1 drop Both Eyes BID  . enoxaparin (LOVENOX) injection  40 mg Subcutaneous Q24H  . furosemide  40 mg Oral BID  . labetalol  200 mg Oral BID  . latanoprost  1 drop Both Eyes QHS  . losartan  100 mg Oral Daily  . mometasone-formoterol  2 puff Inhalation BID  . nicotine  14 mg Transdermal Daily  . NIFEdipine  30 mg Oral Daily  . prednisoLONE acetate  1 drop Both Eyes BID  . sertraline  50 mg Oral Daily   Continuous Infusions: . sodium chloride 75 mL/hr at 09/02/17 1212   PRN Meds:.acetaminophen **OR** acetaminophen (TYLENOL) oral liquid 160 mg/5 mL **OR** acetaminophen  Prior to Admission medications   Medication Sig Start Date End Date Taking? Authorizing Provider  albuterol (PROVENTIL HFA) 108 (90 Base) MCG/ACT inhaler Inhale 2 puffs into the lungs every 4 (four) hours as needed. 06/16/17  Yes [provider]  ALPHAGAN P 0.1 % SOLN Place 1 drop into both eyes. 08/15/17  Yes [provider]  aspirin 81 MG EC tablet Take 1 tablet by mouth daily. 06/17/17 06/17/18 Yes [provider]  diclofenac (VOLTAREN) 75 MG EC tablet Take 75 mg by mouth 2 (two) times daily. for 10 days 08/06/17  Yes [provider]  dorzolamide-timolol (COSOPT) 22.3-6.8 MG/ML ophthalmic solution Place 1 drop into both eyes 2 (two) times daily. 08/26/17  Yes [provider]  furosemide (LASIX) 40 MG tablet Take 40 mg by mouth 2 (two) times daily. 08/15/17  Yes [provider]  labetalol (NORMODYNE) 200 MG tablet Take 200 mg by mouth 2 (two) times daily. 08/15/17  Yes  [provider]  latanoprost (XALATAN) 0.005 % ophthalmic solution Place 1 drop into both eyes at bedtime. 08/15/17  Yes [provider]  losartan (COZAAR) 100 MG tablet Take 100 mg by mouth daily. 08/15/17  Yes [provider]  NIFEdipine (PROCARDIA-XL/ADALAT-CC/NIFEDICAL-XL) 30 MG 24 hr tablet Take 30 mg by mouth daily. 08/15/17  Yes [provider]  prednisoLONE acetate (PRED FORTE) 1 % ophthalmic suspension Place 1 drop into both eyes 2 (two) times daily. 08/15/17  Yes [provider]  sertraline (ZOLOFT) 50 MG tablet Take 50 mg by mouth daily. 07/19/17  Yes [provider]  SYMBICORT 80-4.5 MCG/ACT inhaler Inhale 2 puffs into the lungs 2 (two) times daily. 08/15/17  Yes [provider]      ANALYSIS: A 16 channel recording using standard 10 20 measurements is conducted for 31 minutes.  There is a well-formed posterior dominant rhythm of 10 hertz which attenuates with eye opening.  There is beta activity observed in the frontal areas.  Awake and drowsy activities are observed.  Photic stimulation and hyperventilation are not conducted.  There is no focal or lateralized slowing.  There is no epileptiform activity is observed.    IMPRESSION: 1.  This is a normal recording of awake and drowsy states.      Liyah Higham A. Gerilyn Fuller, M.D.  Diplomate, Biomedical engineerAmerican Board of Psychiatry and Neurology (  Neurology).

## 2017-09-02 NOTE — Progress Notes (Signed)
EEG complete - results pending 

## 2017-09-04 LAB — ANTINUCLEAR ANTIBODIES, IFA: ANA Ab, IFA: NEGATIVE

## 2017-11-23 ENCOUNTER — Encounter: Payer: Self-pay | Admitting: Cardiology

## 2017-11-23 NOTE — Progress Notes (Signed)
Cardiology Office Note  Date: 11/24/2017   ID: Dustin Fuller, DOB May 24, 1960, MRN [id number]  PCP: Benita Stabile, MD  Consulting Cardiologist: Nona Dell, MD   Chief Complaint  Patient presents with  . History of atrial fibrillation    History of Present Illness: Dustin Fuller is a 57 y.o. male referred for cardiology consultation by Dr. Margo Aye to establish follow-up.  At this point I do not have any old records.  Patient states that he underwent an atrial fibrillation ablation through the [facility] within the last 2 to 3 years.  He reports having problems with arrhythmia for quite some time, complicated by crack cocaine abuse.  He was on Coumadin and then ultimately a DOAC, although anticoagulation was discontinued by a previous healthcare provider due to ongoing drug abuse.  He is now in a rehabilitation house, has a full-time job, and states that he has been clean for 6 months.  He reports only occasional brief palpitation, but has had no persistent symptoms or obvious recurrence.  Recent ECG from July showed sinus rhythm. CHADSVASC score is 2.  I reviewed his records and updated the chart.  He was hospitalized in July of this year with right facial drooping and numbness concerning for possible stroke, although brain MRI was negative for acute infarct.  He was seen by Dr. Gerilyn Pilgrim for neurology evaluation, also underwent EEG which showed no evidence of seizure activity.  Otherwise, he states that he feels "good," does not report any recurring chest pain, progressive shortness of breath, or major functional limitation.  I reviewed his recent ECG, echocardiogram, and carotid Doppler results as detailed below.  Past Medical History:  Diagnosis Date  . Bell's palsy   . COPD (chronic obstructive pulmonary disease) (HCC)   . Essential hypertension   . Glaucoma   . History of atrial fibrillation    Reported ablation - [facility]  . History of cocaine abuse (HCC)   . Mixed  hyperlipidemia   . Stroke [facility])     Past Surgical History:  Procedure Laterality Date  . CATARACT EXTRACTION     both eyes  . glacoma     right eye    Current Outpatient Medications  Medication Sig Dispense Refill  . albuterol (PROVENTIL HFA) 108 (90 Base) MCG/ACT inhaler Inhale 2 puffs into the lungs every 4 (four) hours as needed.    . ALPHAGAN P 0.1 % SOLN Place 1 drop into both eyes.  1  . aspirin 81 MG EC tablet Take 1 tablet by mouth daily.    Marland Kitchen atorvastatin (LIPITOR) 20 MG tablet Take 2 tablets (40 mg total) by mouth daily at 6 PM. 30 tablet 0  . diclofenac (VOLTAREN) 75 MG EC tablet Take 75 mg by mouth 2 (two) times daily. for 10 days  0  . dorzolamide-timolol (COSOPT) 22.3-6.8 MG/ML ophthalmic solution Place 1 drop into both eyes 2 (two) times daily.  10  . furosemide (LASIX) 40 MG tablet Take 40 mg by mouth 2 (two) times daily.  5  . labetalol (NORMODYNE) 200 MG tablet Take 200 mg by mouth 2 (two) times daily.  5  . latanoprost (XALATAN) 0.005 % ophthalmic solution Place 1 drop into both eyes at bedtime.  0  . losartan (COZAAR) 100 MG tablet Take 100 mg by mouth daily.  5  . methocarbamol (ROBAXIN) 500 MG tablet Take 1 tablet (500 mg total) by mouth every 6 (six) hours as needed for muscle spasms. 20 tablet 0  .  NIFEdipine (PROCARDIA-XL/ADALAT-CC/NIFEDICAL-XL) 30 MG 24 hr tablet Take 30 mg by mouth daily.  5  . prednisoLONE acetate (PRED FORTE) 1 % ophthalmic suspension Place 1 drop into both eyes 2 (two) times daily.  0  . SYMBICORT 80-4.5 MCG/ACT inhaler Inhale 2 puffs into the lungs 2 (two) times daily.  5   No current facility-administered medications for this visit.    Allergies:  Lisinopril   Social History: The patient  reports that he has been smoking cigarettes. He has been smoking about 0.25 packs per day. He has never used smokeless tobacco. He reports that he drank alcohol. He reports that he has current or past drug history.   Family History: The patient's  family history includes Cancer in his mother.   ROS:  Please see the history of present illness. Otherwise, complete review of systems is positive for none.  All other systems are reviewed and negative.   Physical Exam: VS:  BP 96/62 (BP Location: Left Arm)   Pulse 77   Ht 5\' 11"  (1.803 m)   Wt 237 lb (107.5 kg)   SpO2 98%   BMI 33.05 kg/m , BMI Body mass index is 33.05 kg/m.  Wt Readings from Last 3 Encounters:  11/24/17 237 lb (107.5 kg)  08/31/17 216 lb 12.8 oz (98.3 kg)    General: Obese male, appears comfortable at rest. HEENT: Conjunctiva and lids normal, oropharynx clear. Neck: Supple, no elevated JVP or carotid bruits, no thyromegaly. Lungs: Clear to auscultation, nonlabored breathing at rest. Cardiac: Regular rate and rhythm, no S3, soft systolic murmur, no pericardial rub. Abdomen: Soft, nontender, bowel sounds present, no guarding or rebound. Extremities: No pitting edema, distal pulses 2+. Skin: Warm and dry. Musculoskeletal: No kyphosis. Neuropsychiatric: Alert and oriented x3, affect grossly appropriate.  ECG: I personally reviewed the tracing from 08/31/2017 which showed normal sinus rhythm.  Recent Labwork: 08/31/2017: BUN 17; Creatinine, Ser 0.90; Hemoglobin 15.0; Platelets 193; Potassium 3.8; Sodium 139 09/01/2017: TSH 0.551     Component Value Date/Time   CHOL 206 (H) 09/01/2017 0523   TRIG 75 09/01/2017 0523   HDL 56 09/01/2017 0523   CHOLHDL 3.7 09/01/2017 0523   VLDL 15 09/01/2017 0523   LDLCALC 135 (H) 09/01/2017 0523    Other Studies Reviewed Today:  Echocardiogram 09/01/2017: Study Conclusions  - Left ventricle: The cavity size was normal. Wall thickness was   increased in a pattern of mild LVH. Systolic function was normal.   The estimated ejection fraction was in the range of 55% to 60%.   Wall motion was normal; there were no regional wall motion   abnormalities. Left ventricular diastolic function parameters   were normal. - Aortic  valve: Mildly calcified annulus. Trileaflet; mildly   thickened leaflets. Valve area (VTI): 2.39 cm^2. Valve area   (Vmax): 2.25 cm^2. - Mitral valve: Mildly calcified annulus. Mildly thickened leaflets. - Left atrium: The atrium was mildly dilated. - Technically adequate study.  Carotid Doppler 09/01/2017: IMPRESSION: Less than 50% stenosis in the right and left internal carotid arteries.  Assessment and Plan:  1.  Reported history of atrial fibrillation status post ablation through the [facility] a few years ago.  We are going to try to obtain old records to clarify the situation.  CHADSVASC score is 2, he is currently not on anticoagulation, previously on Coumadin and possibly another DOAC, although discontinued by prior healthcare provider in the setting of drug abuse.  He states that he is now clean and in a  rehabilitation house.  He does not report any prolonged palpitations or chest pain with recent ECG showing sinus rhythm.  Echocardiogram revealed LVEF 55 to 60% with mildly dilated left atrium.  No changes are planned at this time and his medical regimen pending further record review.  2.  Transient neurological symptoms in July without clear evidence of stroke or seizure activity.  He underwent evaluation by Dr. Gerilyn Pilgrim.  3.  Mild carotid atherosclerosis based on Dopplers in July.  He is on aspirin and statin therapy.  4.  Essential hypertension by history, blood pressure is low normal today.  May be able to simplify current antihypertensive medications, keep follow-up with Dr. Margo Aye.  5.  History of crack cocaine abuse, reportedly clean and now living in a rehabilitation house.  Current medicines were reviewed with the patient today.   Orders Placed This Encounter  Procedures  . Flu Vaccine QUAD 36+ mos IM    Disposition: Review records, tentative follow-up in 6 months.  Signed, Jonelle Sidle, MD, [facility] 11/24/2017 11:07 AM    [facility] at  [facility] [address] [place], [place] [address] Phone: [phone]; Fax: [phone]

## 2017-11-24 ENCOUNTER — Encounter: Payer: Self-pay | Admitting: Cardiology

## 2017-11-24 ENCOUNTER — Ambulatory Visit (INDEPENDENT_AMBULATORY_CARE_PROVIDER_SITE_OTHER): Payer: Medicaid Other | Admitting: Cardiology

## 2017-11-24 VITALS — BP 96/62 | HR 77 | Ht 71.0 in | Wt 237.0 lb

## 2017-11-24 DIAGNOSIS — Z8679 Personal history of other diseases of the circulatory system: Secondary | ICD-10-CM

## 2017-11-24 DIAGNOSIS — I10 Essential (primary) hypertension: Secondary | ICD-10-CM | POA: Diagnosis not present

## 2017-11-24 DIAGNOSIS — I6523 Occlusion and stenosis of bilateral carotid arteries: Secondary | ICD-10-CM

## 2017-11-24 DIAGNOSIS — F1411 Cocaine abuse, in remission: Secondary | ICD-10-CM

## 2017-11-24 DIAGNOSIS — Z23 Encounter for immunization: Secondary | ICD-10-CM

## 2017-11-24 NOTE — Patient Instructions (Addendum)
Your physician wants you to follow-up in:6 months  with Dr.McDowell You will receive a reminder letter in the mail two months in advance. If you don't receive a letter, please call our office to schedule the follow-up appointment.    Your physician recommends that you continue on your current medications as directed. Please refer to the Current Medication list given to you today.    If you need a refill on your cardiac medications before your next appointment, please call your pharmacy.    No lab work or tests today.    Please call back with previous MD name and phone number     Thank you for choosing [facility] !        .[email or web address].

## 2017-11-29 ENCOUNTER — Telehealth: Payer: Self-pay

## 2017-11-29 DIAGNOSIS — Z79899 Other long term (current) drug therapy: Secondary | ICD-10-CM

## 2017-11-29 MED ORDER — APIXABAN 5 MG PO TABS: 5.0000 mg | ORAL_TABLET | Freq: Two times a day (BID) | ORAL | 11 refills | Status: DC

## 2017-11-29 NOTE — Telephone Encounter (Signed)
I spoke with staff at [facility], they feel patient would agree to take eliquis 5 mg BID.I e-scribed to [facility], I mailed lab slips to them and will fax this letter

## 2017-11-29 NOTE — Telephone Encounter (Signed)
-----   Message from Jonelle Sidle, MD sent at 11/29/2017  3:39 PM EDT ----- I reviewed records from previous cardiac evaluation and atrial fibrillation ablation.  His CHADSVASC score is 3 based on history, and it would seem that he needs to be continued on long-term anticoagulation even though he underwent an ablation and was in sinus rhythm recently.  Particularly with possible neurological symptoms documented in the last few months.  If he is willing to resume Eliquis 5 mg twice daily and stop aspirin, please make this change and arrange a CBC and BMET in the next 3 months.

## 2017-11-29 NOTE — Progress Notes (Signed)
Records received from [facility] from [place].  He has a history of paroxysmal atrial fibrillation/flutter status post pulmonary vein isolation ablation on April 07, 2016 (Dr. Sabino Donovan).  Prior medical therapy includes flecainide for rhythm control, verapamil for heart rate control with previous rash on diltiazem, and more recently Coreg.  He has a documented history of noncompliance including with anticoagulation.  Also history of left frontal lobe ischemic stroke in 2017.  She was on Eliquis as of office follow-up visit in May 2018 as well as continue on flecainide 50 mg twice daily.

## 2017-11-29 NOTE — Telephone Encounter (Signed)
LMTCB-cc 

## 2018-12-18 ENCOUNTER — Other Ambulatory Visit: Payer: Self-pay

## 2018-12-18 ENCOUNTER — Observation Stay (HOSPITAL_COMMUNITY)
Admission: EM | Admit: 2018-12-18 | Discharge: 2018-12-18 | Disposition: A | Discharge status: Home / Self Care | Payer: Medicaid Other | Attending: Family Medicine | Admitting: Family Medicine

## 2018-12-18 ENCOUNTER — Encounter (HOSPITAL_COMMUNITY): Payer: Self-pay

## 2018-12-18 ENCOUNTER — Emergency Department (HOSPITAL_COMMUNITY): Payer: Medicaid Other

## 2018-12-18 ENCOUNTER — Observation Stay (HOSPITAL_BASED_OUTPATIENT_CLINIC_OR_DEPARTMENT_OTHER): Payer: Medicaid Other

## 2018-12-18 DIAGNOSIS — R9431 Abnormal electrocardiogram [ECG] [EKG]: Secondary | ICD-10-CM | POA: Diagnosis not present

## 2018-12-18 DIAGNOSIS — Z7901 Long term (current) use of anticoagulants: Secondary | ICD-10-CM | POA: Diagnosis not present

## 2018-12-18 DIAGNOSIS — J449 Chronic obstructive pulmonary disease, unspecified: Secondary | ICD-10-CM | POA: Diagnosis present

## 2018-12-18 DIAGNOSIS — E782 Mixed hyperlipidemia: Secondary | ICD-10-CM | POA: Insufficient documentation

## 2018-12-18 DIAGNOSIS — I2510 Atherosclerotic heart disease of native coronary artery without angina pectoris: Secondary | ICD-10-CM | POA: Diagnosis not present

## 2018-12-18 DIAGNOSIS — R200 Anesthesia of skin: Principal | ICD-10-CM | POA: Insufficient documentation

## 2018-12-18 DIAGNOSIS — Z79899 Other long term (current) drug therapy: Secondary | ICD-10-CM | POA: Insufficient documentation

## 2018-12-18 DIAGNOSIS — R519 Headache, unspecified: Secondary | ICD-10-CM | POA: Diagnosis not present

## 2018-12-18 DIAGNOSIS — Z7951 Long term (current) use of inhaled steroids: Secondary | ICD-10-CM | POA: Insufficient documentation

## 2018-12-18 DIAGNOSIS — I4891 Unspecified atrial fibrillation: Secondary | ICD-10-CM | POA: Diagnosis not present

## 2018-12-18 DIAGNOSIS — I083 Combined rheumatic disorders of mitral, aortic and tricuspid valves: Secondary | ICD-10-CM | POA: Diagnosis not present

## 2018-12-18 DIAGNOSIS — I361 Nonrheumatic tricuspid (valve) insufficiency: Secondary | ICD-10-CM

## 2018-12-18 DIAGNOSIS — F17200 Nicotine dependence, unspecified, uncomplicated: Secondary | ICD-10-CM | POA: Insufficient documentation

## 2018-12-18 DIAGNOSIS — Z720 Tobacco use: Secondary | ICD-10-CM | POA: Diagnosis present

## 2018-12-18 DIAGNOSIS — F1410 Cocaine abuse, uncomplicated: Secondary | ICD-10-CM | POA: Diagnosis not present

## 2018-12-18 DIAGNOSIS — G459 Transient cerebral ischemic attack, unspecified: Secondary | ICD-10-CM | POA: Diagnosis not present

## 2018-12-18 DIAGNOSIS — Z20828 Contact with and (suspected) exposure to other viral communicable diseases: Secondary | ICD-10-CM | POA: Insufficient documentation

## 2018-12-18 DIAGNOSIS — H409 Unspecified glaucoma: Secondary | ICD-10-CM | POA: Insufficient documentation

## 2018-12-18 DIAGNOSIS — I10 Essential (primary) hypertension: Secondary | ICD-10-CM | POA: Diagnosis present

## 2018-12-18 DIAGNOSIS — F17210 Nicotine dependence, cigarettes, uncomplicated: Secondary | ICD-10-CM | POA: Insufficient documentation

## 2018-12-18 LAB — PROTIME-INR
INR: 1.3 — ABNORMAL HIGH (ref 0.8–1.2)
Prothrombin Time: 15.9 s — ABNORMAL HIGH (ref 11.4–15.2)

## 2018-12-18 LAB — URINALYSIS, ROUTINE W REFLEX MICROSCOPIC
Bilirubin Urine: NEGATIVE
Glucose, UA: NEGATIVE mg/dL
Hgb urine dipstick: NEGATIVE
Ketones, ur: NEGATIVE mg/dL
Leukocytes,Ua: NEGATIVE
Nitrite: NEGATIVE
Protein, ur: NEGATIVE mg/dL
Specific Gravity, Urine: 1.005 (ref 1.005–1.030)
pH: 5 (ref 5.0–8.0)

## 2018-12-18 LAB — COMPREHENSIVE METABOLIC PANEL WITH GFR
ALT: 20 U/L (ref 0–44)
AST: 15 U/L (ref 15–41)
Albumin: 4.5 g/dL (ref 3.5–5.0)
Alkaline Phosphatase: 63 U/L (ref 38–126)
Anion gap: 10 (ref 5–15)
BUN: 17 mg/dL (ref 6–20)
CO2: 28 mmol/L (ref 22–32)
Calcium: 9.4 mg/dL (ref 8.9–10.3)
Chloride: 102 mmol/L (ref 98–111)
Creatinine, Ser: 0.8 mg/dL (ref 0.61–1.24)
GFR calc Af Amer: >60 mL/min
GFR calc non Af Amer: >60 mL/min
Glucose, Bld: 101 mg/dL — ABNORMAL HIGH (ref 70–99)
Potassium: 4.1 mmol/L (ref 3.5–5.1)
Sodium: 140 mmol/L (ref 135–145)
Total Bilirubin: 0.8 mg/dL (ref 0.3–1.2)
Total Protein: 7.7 g/dL (ref 6.5–8.1)

## 2018-12-18 LAB — RAPID URINE DRUG SCREEN, HOSP PERFORMED
Amphetamines: NOT DETECTED
Barbiturates: NOT DETECTED
Benzodiazepines: NOT DETECTED
Cocaine: NOT DETECTED
Opiates: NOT DETECTED
Tetrahydrocannabinol: NOT DETECTED

## 2018-12-18 LAB — CBC WITH DIFFERENTIAL/PLATELET
Abs Immature Granulocytes: 0.02 10*3/uL (ref 0.00–0.07)
Basophils Absolute: 0 10*3/uL (ref 0.0–0.1)
Basophils Relative: 1 %
Eosinophils Absolute: 0.2 10*3/uL (ref 0.0–0.5)
Eosinophils Relative: 3 %
HCT: 48.4 % (ref 39.0–52.0)
Hemoglobin: 15.3 g/dL (ref 13.0–17.0)
Immature Granulocytes: 0 %
Lymphocytes Relative: 20 %
Lymphs Abs: 1.3 10*3/uL (ref 0.7–4.0)
MCH: 30.4 pg (ref 26.0–34.0)
MCHC: 31.6 g/dL (ref 30.0–36.0)
MCV: 96.2 fL (ref 80.0–100.0)
Monocytes Absolute: 0.4 10*3/uL (ref 0.1–1.0)
Monocytes Relative: 7 %
Neutro Abs: 4.5 10*3/uL (ref 1.7–7.7)
Neutrophils Relative %: 69 %
Platelets: 189 10*3/uL (ref 150–400)
RBC: 5.03 MIL/uL (ref 4.22–5.81)
RDW: 13.2 % (ref 11.5–15.5)
WBC: 6.4 10*3/uL (ref 4.0–10.5)
nRBC: 0 % (ref 0.0–0.2)

## 2018-12-18 LAB — ECHOCARDIOGRAM COMPLETE
Height: 71 in
Weight: 3734.4 [oz_av]

## 2018-12-18 LAB — CBG MONITORING, ED: Glucose-Capillary: 108 mg/dL — ABNORMAL HIGH (ref 70–99)

## 2018-12-18 LAB — SARS CORONAVIRUS 2 BY RT PCR (HOSPITAL ORDER, PERFORMED IN ~~LOC~~ HOSPITAL LAB): SARS Coronavirus 2: NEGATIVE

## 2018-12-18 LAB — ETHANOL: Alcohol, Ethyl (B): <10 mg/dL (ref <10)

## 2018-12-18 LAB — APTT: aPTT: 32 seconds (ref 24–36)

## 2018-12-18 MED ORDER — PNEUMOCOCCAL VAC POLYVALENT 25 MCG/0.5ML IJ INJ: 0.5000 mL | INJECTION | INTRAMUSCULAR | Status: DC

## 2018-12-18 MED ORDER — HYDRALAZINE HCL 50 MG PO TABS: 50.0000 mg | ORAL_TABLET | Freq: Three times a day (TID) | ORAL | 5 refills | Status: DC

## 2018-12-18 MED ORDER — ALPHAGAN P 0.1 % OP SOLN: 1.0000 [drp] | Freq: Two times a day (BID) | OPHTHALMIC | 1 refills | Status: DC

## 2018-12-18 MED ORDER — SYMBICORT 80-4.5 MCG/ACT IN AERO: 2.0000 | INHALATION_SPRAY | Freq: Two times a day (BID) | RESPIRATORY_TRACT | 5 refills | Status: AC

## 2018-12-18 MED ORDER — PREDNISOLONE ACETATE 1 % OP SUSP: 1.0000 [drp] | Freq: Two times a day (BID) | OPHTHALMIC | 3 refills | Status: DC

## 2018-12-18 MED ORDER — OFLOXACIN 0.3 % OP SOLN: 1.0000 [drp] | Freq: Four times a day (QID) | OPHTHALMIC | 4 refills | Status: DC

## 2018-12-18 MED ORDER — APIXABAN 5 MG PO TABS: 5.0000 mg | ORAL_TABLET | Freq: Two times a day (BID) | ORAL | 11 refills | Status: AC

## 2018-12-18 MED ORDER — ISOSORBIDE MONONITRATE ER 30 MG PO TB24: 30.0000 mg | ORAL_TABLET | Freq: Every day | ORAL | 11 refills | Status: DC

## 2018-12-18 MED ORDER — DORZOLAMIDE HCL-TIMOLOL MAL 2-0.5 % OP SOLN: 1.0000 [drp] | Freq: Two times a day (BID) | OPHTHALMIC | 10 refills | Status: DC

## 2018-12-18 MED ORDER — CARVEDILOL 12.5 MG PO TABS: 12.5000 mg | ORAL_TABLET | Freq: Two times a day (BID) | ORAL | 5 refills | Status: DC

## 2018-12-18 MED ORDER — ALBUTEROL SULFATE HFA 108 (90 BASE) MCG/ACT IN AERS: 2.0000 | INHALATION_SPRAY | RESPIRATORY_TRACT | 2 refills | Status: AC | PRN

## 2018-12-18 MED ORDER — LATANOPROST 0.005 % OP SOLN: 1.0000 [drp] | Freq: Every day | OPHTHALMIC | 0 refills | Status: DC

## 2018-12-18 MED ORDER — LABETALOL HCL 200 MG PO TABS: 100.0000 mg | ORAL_TABLET | Freq: Two times a day (BID) | ORAL | 5 refills | Status: DC

## 2018-12-18 MED ORDER — ATORVASTATIN CALCIUM 40 MG PO TABS: 40.0000 mg | ORAL_TABLET | Freq: Every evening | ORAL | 5 refills | Status: DC

## 2018-12-18 MED ORDER — LOSARTAN POTASSIUM 50 MG PO TABS: 50.0000 mg | ORAL_TABLET | Freq: Every day | ORAL | 5 refills | Status: DC

## 2018-12-18 MED ORDER — TRAZODONE HCL 50 MG PO TABS: 50.0000 mg | ORAL_TABLET | Freq: Every day | ORAL | 5 refills | Status: DC

## 2018-12-18 MED ORDER — ACETAMINOPHEN 500 MG PO TABS
1000.0000 mg | ORAL_TABLET | Freq: Once | ORAL | Status: AC
  Administered 2018-12-18: 10:00:00 1000 mg via ORAL
  Filled 2018-12-18: qty 2

## 2018-12-18 NOTE — Discharge Instructions (Signed)
1) you are taking apixaban/Eliquis which is your blood thinner so please avoid ibuprofen/Advil/Aleve/Motrin/Goody Powders/Naproxen/BC powders/Meloxicam/Diclofenac/Indomethacin and other Nonsteroidal anti-inflammatory medications as these will make you more likely to bleed and can cause stomach ulcers, can also cause Kidney problems.   2)Your Blood Pressure medications have been Adjusted--- Please Note the various medication changes--  3)Please check your blood pressure daily and keep a diary/chart--please take this diary with you when you see your primary care doctor in 1 to 2 weeks for recheck of your blood pressure and possible further adjustment of your blood pressure medications  4) please follow-up with Neurologist Dr. Phillips Odor in 2 to 3 weeks for recheck and reevaluation  address: [address], [place], [place] [address] Phone: [phone]  5) complete abstinence from tobacco/cigarettes advised to reduce your risk for stroke, heart attack and cancers--- you may use over-the-counter nicotine patch to help you quit smoking  - Your coronavirus test/COVID-19 test was negative -Your brain imaging studies including CAT scan and MRI did not show any acute stroke at this time -Your echocardiogram-heart ultrasound was reassuring

## 2018-12-18 NOTE — ED Notes (Signed)
IV access attempted x2 without success.

## 2018-12-18 NOTE — Progress Notes (Signed)
Code stroke CT times 0845 call time 0848 beeper 0848 exam started 0850 exam finished 0850 images sent to [place] exam completed [phone] [facility] radiology called

## 2018-12-18 NOTE — ED Triage Notes (Addendum)
Pt reports approx one hour ago he had sudden onset on right sided HA and left arm numbness. Pt reports taking eliquis this morning. Pt woke up at 0530 this am and noticed symptoms at [facility]

## 2018-12-18 NOTE — Progress Notes (Signed)
*  PRELIMINARY RESULTS* Echocardiogram 2D Echocardiogram has been performed.  Dustin Fuller 12/18/2018, 1:09 PM

## 2018-12-18 NOTE — Consult Note (Signed)
[facility] Tele[facility]urology Consult Services   Date of Service:   12/18/2018 08:55:57  Impression:     .  Small Vessel Infarct     .  Right Hemispheric Infarct  Comments/Sign-Out: Patient has mild weakness and numbness of the left side suggesting small vessel lesion in the thalamus/internal capsule region in the right. Not a candidate for thrombolytic therapy because of Eliquis. No indication at this point for advanced imaging is this is likely small vessel occlusion. Blood pressure is higher needs work. He needs passive hypertension today than gradual improvement. He needs A1c, fasting lipid profile, EKG monitoring, rehabilitation evaluation, echocardiogram, carotid ultrasound.  Mechanism of Stroke: Small Vessel Disease  Metrics: Last Known Well: 12/18/2018 07:45:00 [facility] Notification Time: 12/18/2018 08:55:57 Arrival Time: 12/18/2018 08:42:00 Stamp Time: 12/18/2018 08:55:57 Time First Login Attempt: 12/18/2018 08:59:00 Video Start Time: 12/18/2018 08:59:00  Symptoms: Left arm numbness and weakness NIHSS Start Assessment Time: 12/18/2018 09:07:59 Patient is not a candidate for Alteplase/Activase. Patient was not deemed candidate for Alteplase/Activase thrombolytics because of Use of NOAs within 48 hours. Video End Time: 12/18/2018 09:15:00  CT head showed no acute hemorrhage or acute core infarct. CT head was reviewed and results were: No evidence of acute ischemic change, hemorrhage, or mass lesion.  Clinical Presentation is not Suggestive of Large Vessel Occlusive Disease  Radiologist was not called back for review of advanced imaging because Not indicated ED Physician notified of diagnostic impression and management plan on 12/18/2018 09:17:46  Our recommendations are outlined below.  Recommendations:     .  Activate Stroke Protocol Admission/Order Set     .  Stroke/Telemetry Floor     .  Neuro Checks     .  Bedside Swallow Eval     .  DVT  Prophylaxis     .  IV Fluids, Normal Saline     .  Head of Bed 30 Degrees     .  Euglycemia and Avoid Hyperthermia (PRN Acetaminophen)     .  Antiplatelet Therapy Recommended  Routine Consultation with [facility] for Follow up Care  Sign Out:     .  Discussed with Emergency Department Provider    ------------------------------------------------------------------------------  History of Present Illness: Patient is a 58 year old Male.  Patient was brought by private transportation with symptoms of Left arm numbness and weakness  This is a 58 year old man who at approximately 0745 this morning developed right-sided headache and left arm numbness. There was weakness as well. He describes having chronic facial droop on the right because of Bell's palsy that never resolved. He also has low vision in the right I due to glaucoma and actually had glaucoma surgery three days ago. He has a subtle visual field loss in the right I only. He is 1/2 pack per day smoker. He took his Eliquis this morning for a fib.    Past Medical History:     . Hypertension     . Hyperlipidemia     . Atrial Fibrillation     . Coronary Artery Disease     . There is NO history of Diabetes Mellitus     . There is NO history of Stroke  Anticoagulant use:  Eliquis  Antiplatelet use: No  Examination: BP(181/104), Pulse(72), Blood Glucose(108) 1A: Level of Consciousness - Alert; keenly responsive + 0 1B: Ask Month and Age - Both Questions Right + 0 1C: Blink Eyes & Squeeze Hands - Performs Both Tasks + 0 2: Test Horizontal Extraocular Movements -  Normal + 0 3: Test Visual Fields - Partial Hemianopia + 1 4: Test Facial Palsy (Use Grimace if Obtunded) - Minor paralysis (flat nasolabial fold, smile asymmetry) + 1 5A: Test Left Arm Motor Drift - No Drift for 10 Seconds + 0 5B: Test Right Arm Motor Drift - Drift, but doesn't hit bed + 1 6A: Test Left Leg Motor Drift - No Drift for 5 Seconds + 0 6B: Test Right  Leg Motor Drift - No Drift for 5 Seconds + 0 7: Test Limb Ataxia (FNF/Heel-Shin) - No Ataxia + 0 8: Test Sensation - Mild-Moderate Loss: Less Sharp/More Dull + 1 9: Test Language/Aphasia - Normal; No aphasia + 0 10: Test Dysarthria - Normal + 0 11: Test Extinction/Inattention - No abnormality + 0  NIHSS Score: 4  Patient/Family was informed the Neurology Consult would happen via TeleHealth consult by way of interactive audio and video telecommunications and consented to receiving care in this manner.   Due to the immediate potential for life-threatening deterioration due to underlying acute neurologic illness, I spent 30 minutes providing critical care. This time includes time for face to face visit via telemedicine, review of medical records, imaging studies and discussion of findings with providers, the patient and/or family.   Dr Elray Mcgregor   [facility] [phone]  Case [id number]

## 2018-12-18 NOTE — ED Provider Notes (Signed)
[facility] EMERGENCY DEPARTMENT Provider Note   CSN: [id number] Arrival date & time: 12/18/18  0623  An emergency department physician performed an initial assessment on this suspected stroke patient at 0849.  History   Chief Complaint Chief Complaint  Patient presents with  . Code Stroke    HPI Dustin Fuller is a 58 y.o. male.     Level 5 caveat for urgency of condition.  Last normal 0745.  Patient reports abrupt onset of right temporal headache followed by a sensation of left arm numbness and tingling.  No visual changes, difficulty swallowing, facial asymmetry, lower extremity deficits.  Past medical history includes hypertension, hyperlipidemia, Bell's palsy, COPD, atrial fibrillation, history of cocaine abuse.  He is currently drug-free.     Past Medical History:  Diagnosis Date  . Bell's palsy   . COPD (chronic obstructive pulmonary disease) (HCC)   . Essential hypertension   . Glaucoma   . History of atrial fibrillation    Reported ablation - [facility]  . History of cocaine abuse (HCC)   . Mixed hyperlipidemia     Patient Active Problem List   Diagnosis Date Noted  . Right arm weakness   . Facial droop 08/31/2017  . Tobacco abuse 08/31/2017  . COPD (chronic obstructive pulmonary disease) (HCC) 08/31/2017  . HTN (hypertension) 08/31/2017    Past Surgical History:  Procedure Laterality Date  . CATARACT EXTRACTION     both eyes  . glacoma     right eye        Home Medications    Prior to Admission medications   Medication Sig Start Date End Date Taking? Authorizing Provider  albuterol (PROVENTIL HFA) 108 (90 Base) MCG/ACT inhaler Inhale 2 puffs into the lungs every 4 (four) hours as needed. 06/16/17  Yes [provider]  ALPHAGAN P 0.1 % SOLN Place 1 drop into both eyes 2 (two) times daily.  08/15/17  Yes [provider]  apixaban (ELIQUIS) 5 MG TABS tablet Take 1 tablet (5 mg total) by mouth 2 (two) times daily. 11/29/17  Yes  Jonelle Sidle, MD  atorvastatin (LIPITOR) 20 MG tablet Take 2 tablets (40 mg total) by mouth daily at 6 PM. Patient taking differently: Take 20 mg by mouth daily at 6 PM.  09/02/17  Yes Erick Blinks, MD  carvedilol (COREG) 12.5 MG tablet Take 25 mg by mouth 2 (two) times daily. 11/17/18  Yes [provider]  dorzolamide-timolol (COSOPT) 22.3-6.8 MG/ML ophthalmic solution Place 1 drop into the right eye 2 (two) times daily.  08/26/17  Yes [provider]  furosemide (LASIX) 20 MG tablet Take 20 mg by mouth daily.  08/15/17  Yes [provider]  hydrALAZINE (APRESOLINE) 25 MG tablet Take 25 mg by mouth 2 (two) times daily. 11/23/18  Yes [provider]  labetalol (NORMODYNE) 200 MG tablet Take 100 mg by mouth 2 (two) times daily.  08/15/17  Yes [provider]  losartan (COZAAR) 50 MG tablet Take 50 mg by mouth daily. 10/25/18  Yes [provider]  ofloxacin (OCUFLOX) 0.3 % ophthalmic solution Place 1 drop into the right eye 4 (four) times daily. 11/29/18  Yes [provider]  prednisoLONE acetate (PRED FORTE) 1 % ophthalmic suspension Place 1 drop into the right eye 2 (two) times daily.  08/15/17  Yes [provider]  SYMBICORT 80-4.5 MCG/ACT inhaler Inhale 2 puffs into the lungs 2 (two) times daily. 08/15/17  Yes [provider]  traZODone (DESYREL) 50  MG tablet Take 50 mg by mouth at bedtime. 11/17/18  Yes [provider]  latanoprost (XALATAN) 0.005 % ophthalmic solution Place 1 drop into both eyes at bedtime. 08/15/17   [provider]  NIFEdipine (PROCARDIA-XL/ADALAT-CC/NIFEDICAL-XL) 30 MG 24 hr tablet Take 30 mg by mouth daily. 08/15/17   [provider]    Family History Family History  Problem Relation Age of Onset  . Cancer Mother     Social History Social History   Tobacco Use  . Smoking status: Current Every Day Smoker    Packs/day: 0.25    Types: Cigarettes  . Smokeless  tobacco: Never Used  Substance Use Topics  . Alcohol use: Not Currently  . Drug use: Not Currently     Allergies   Diltiazem, Penicillins, Tiotropium bromide monohydrate, and Lisinopril   Review of Systems Review of Systems  Unable to perform ROS: Acuity of condition     Physical Exam Updated Vital Signs BP (!) 163/92   Pulse 65   Resp 13   Ht 5\' 11"  (1.803 m)   Wt 105.9 kg   SpO2 97%   BMI 32.55 kg/m   Physical Exam Vitals signs and nursing note reviewed.  Constitutional:      Appearance: He is well-developed.  HENT:     Head: Normocephalic and atraumatic.  Eyes:     Conjunctiva/sclera: Conjunctivae normal.  Neck:     Musculoskeletal: Neck supple.  Cardiovascular:     Rate and Rhythm: Normal rate and regular rhythm.  Pulmonary:     Effort: Pulmonary effort is normal.     Breath sounds: Normal breath sounds.  Abdominal:     General: Bowel sounds are normal.     Palpations: Abdomen is soft.  Musculoskeletal: Normal range of motion.  Skin:    General: Skin is warm and dry.  Neurological:     Mental Status: He is alert and oriented to person, place, and time.     Comments: Questionable decreased sensation in left upper extremity.  Psychiatric:        Behavior: Behavior normal.      ED Treatments / Results  Labs (all labs ordered are listed, but only abnormal results are displayed) Labs Reviewed  URINALYSIS, ROUTINE W REFLEX MICROSCOPIC - Abnormal; Notable for the following components:      Result Value   Color, Urine STRAW (*)    All other components within normal limits  CBG MONITORING, ED - Abnormal; Notable for the following components:   Glucose-Capillary 108 (*)    All other components within normal limits  SARS CORONAVIRUS 2 BY RT PCR (HOSPITAL ORDER, PERFORMED IN [facility])  ETHANOL  RAPID URINE DRUG SCREEN, HOSP PERFORMED  CBC WITH DIFFERENTIAL/PLATELET  COMPREHENSIVE METABOLIC PANEL  PROTIME-INR  APTT    EKG EKG  Interpretation  Date/Time:  Monday December 18 2018 09:22:19 EDT Ventricular Rate:  69 PR Interval:    QRS Duration: 83 QT Interval:  396 QTC Calculation: 425 R Axis:   25 Text Interpretation:  Sinus rhythm Minimal ST elevation, anterior leads Confirmed by Donnetta Hutchingook, Clytie Shetley ([id number]) on 12/18/2018 9:44:42 AM   Radiology Ct Head Code Stroke Wo Contrast  Result Date: 12/18/2018 CLINICAL DATA:  Code stroke. Right-sided headache with left arm numbness. EXAM: CT HEAD WITHOUT CONTRAST TECHNIQUE: Contiguous axial images were obtained from the base of the skull through the vertex without intravenous contrast. COMPARISON:  CT head 08/31/2017 FINDINGS: Brain: No evidence of acute infarction, hemorrhage, hydrocephalus, extra-axial collection  or mass lesion/mass effect. Vascular: Negative for hyperdense vessel. Atherosclerotic calcification in the cavernous carotid bilaterally. Skull: Negative Sinuses/Orbits: Paranasal sinuses clear.  Bilateral ocular surgery. Other: None ASPECTS ([place] Stroke Program Early CT Score) - Ganglionic level infarction (caudate, lentiform nuclei, internal capsule, insula, M1-M3 cortex): 7 - Supraganglionic infarction (M4-M6 cortex): 3 Total score (0-10 with 10 being normal): 10 IMPRESSION: 1. No acute intracranial abnormality 2. ASPECTS is 10 3. These results were called by telephone at the time of interpretation on 12/18/2018 at 9:04 am to provider [facility] , who verbally acknowledged these results. Electronically Signed   By: Franchot Gallo M.D.   On: 12/18/2018 09:05    Procedures Procedures (including critical care time)  Medications Ordered in ED Medications - No data to display   Initial Impression / Assessment and Plan / ED Course  I have reviewed the triage vital signs and the nursing notes.  Pertinent labs & imaging results that were available during my care of the patient were reviewed by me and considered in my medical decision making (see chart for details).        Code stroke initiated.  Initial CT head negative.  Discussed with teleneurologist.  He suggested no thrombolytics secondary to already being on Eliquis.  Will obtain MRI.  Discussed with local neurologist.  Admit.   CRITICAL CARE Performed by: Nat Christen Total critical care time: 30 minutes Critical care time was exclusive of separately billable procedures and treating other patients. Critical care was necessary to treat or prevent imminent or life-threatening deterioration. Critical care was time spent personally by me on the following activities: development of treatment plan with patient and/or surrogate as well as nursing, discussions with consultants, evaluation of patient's response to treatment, examination of patient, obtaining history from patient or surrogate, ordering and performing treatments and interventions, ordering and review of laboratory studies, ordering and review of radiographic studies, pulse oximetry and re-evaluation of patient's condition.   Final Clinical Impressions(s) / ED Diagnoses   Final diagnoses:  Left arm numbness  Right temporal headache    ED Discharge Orders    None       Nat Christen, MD 12/18/18 1006

## 2018-12-18 NOTE — Consult Note (Signed)
Dustin Fuller, is a 58 y.o. male  DOB 1960-05-04  MRN [id number].  Admission date:  12/18/2018  Admitting Physician  No admitting provider for patient encounter.  Discharge Date:  12/18/2018   Primary MD  Benita Stabile, MD  Recommendations for primary care physician for things to follow:    1) you are taking apixaban/Eliquis which is your blood thinner so please avoid ibuprofen/Advil/Aleve/Motrin/Goody Powders/Naproxen/BC powders/Meloxicam/Diclofenac/Indomethacin and other Nonsteroidal anti-inflammatory medications as these will make you more likely to bleed and can cause stomach ulcers, can also cause Kidney problems.   2)Your Blood Pressure medications have been Adjusted--- Please Note the various medication changes--  3)Please check your blood pressure daily and keep a diary/chart--please take this diary with you when you see your primary care doctor in 1 to 2 weeks for recheck of your blood pressure and possible further adjustment of your blood pressure medications  4) please follow-up with Neurologist Dr. Beryle Beams in 2 to 3 weeks for recheck and reevaluation  address: [address], [place], [place] [address] Phone: [phone]  5) complete abstinence from tobacco/cigarettes advised to reduce your risk for stroke, heart attack and cancers--- you may use over-the-counter nicotine patch to help you quit smoking  - Your coronavirus test/COVID-19 test was negative -Your brain imaging studies including CAT scan and MRI did not show any acute stroke at this time -Your echocardiogram-heart ultrasound was reassuring   Admission Diagnosis  HEAD PAIN,LT ARM NUMB,BP HIGH   Discharge Diagnosis  HEAD PAIN,LT ARM NUMB,BP HIGH   Principal Problem:   TIA (transient ischemic attack) Active Problems:   Tobacco abuse   COPD (chronic obstructive pulmonary disease) (HCC)   HTN  (hypertension)      Past Medical History:  Diagnosis Date  . Bell's palsy   . COPD (chronic obstructive pulmonary disease) (HCC)   . Essential hypertension   . Glaucoma   . History of atrial fibrillation    Reported ablation - [facility]  . History of cocaine abuse (HCC)   . Mixed hyperlipidemia     Past Surgical History:  Procedure Laterality Date  . CATARACT EXTRACTION     both eyes  . glacoma     right eye     HPI  from the history and physical done on the day of admission:   58 year old with past medical history relevant for  chronic atrial fibrillation on anticoagulation with Eliquis, status post prior ablation, HLD, HTN , history of Bell's palsy as well as tobacco abuse and COPD, as well as prior history of cocaine abuse (currently drug-free), presents to ED with right-sided headaches and left-sided upper extremity numbness-- -patient woke up around 5:30 AM, he noticed the symptoms first around 7:45 AM -No chest pains no palpitations no dizziness, no pleuritic symptoms no leg swelling no leg pains  -No nausea no vomiting no diarrhea, -No fevers or chills -No speech or swallowing difficulties,  -EDP consulted telemetry neurologist recommended stroke work-up-  -Echocardiogram reassuring and largely unchanged from echo of  08/2017, EF is 55 to 27%, with diastolic dysfunction -Carotid artery Dopplers from 09/10/2017 without hemodynamically significant stenosis  -CT head and MRI brain without acute findings -COVID-19 negative -Blood alcohol level and UDS negative -CBC unremarkable -CMP unremarkable -UA unremarkable    Hospital Course:   1) possible TIA-  on  monitored in ED no significant arrhythmia noted .  CT head and MRI of the brain without acute findings echocardiogram without intracardiac thrombus  and   EF is 55 to 60%,, carotid artery Dopplers July 2019 without hemodynamically significant stenosis   -Patient already on Eliquis,  -Increase Lipitor to 40 mg  daily -Outpatient follow-up with neurologist Dr. Merlene Laughter advised -No new focal deficits, no speech or swallowing concerns  2) COPD and tobacco abuse--- smoking cessation advised, bronchodilators as ordered  3)HTN-BP medications adjusted,  4) chronic A. Fib--continue Coreg for rate control, Eliquis for stroke prophylaxis  Disposition-stroke work-up is essentially negative, cannot rule out TIA, medications as noted, follow-up with neurologist as outpatient  Discharge Condition: stable  Follow UP--with neurologist as advised  Diet and Activity recommendation:  As advised  Discharge Instructions   Discharge Instructions    Call MD for:  difficulty breathing, headache or visual disturbances   Complete by: As directed    Call MD for:  persistant dizziness or light-headedness   Complete by: As directed    Call MD for:  persistant nausea and vomiting   Complete by: As directed    Call MD for:  temperature >100.4   Complete by: As directed    Diet - low sodium heart healthy   Complete by: As directed    Discharge instructions   Complete by: As directed    1) you are taking apixaban/Eliquis which is your blood thinner so please avoid ibuprofen/Advil/Aleve/Motrin/Goody Powders/Naproxen/BC powders/Meloxicam/Diclofenac/Indomethacin and other Nonsteroidal anti-inflammatory medications as these will make you more likely to bleed and can cause stomach ulcers, can also cause Kidney problems.   2)Your Blood Pressure medications have been Adjusted--- Please Note the various medication changes--  3)Please check your blood pressure daily and keep a diary/chart--please take this diary with you when you see your primary care doctor in 1 to 2 weeks for recheck of your blood pressure and possible further adjustment of your blood pressure medications  4) please follow-up with Neurologist Dr. Phillips Odor in 2 to 3 weeks for recheck and reevaluation  address: [address], [place], [place]  [address] Phone: [phone]  5) complete abstinence from tobacco/cigarettes advised to reduce your risk for stroke, heart attack and cancers--- you may use over-the-counter nicotine patch to help you quit smoking  - Your coronavirus test/COVID-19 test was negative -Your brain imaging studies including CAT scan and MRI did not show any acute stroke at this time -Your echocardiogram-heart ultrasound was reassuring   Increase activity slowly   Complete by: As directed        Discharge Medications     Allergies as of 12/18/2018      Reactions   Diltiazem Other (See Comments)   IV   Penicillins Other (See Comments)   Tiotropium Bromide Monohydrate Other (See Comments)   Lisinopril       Medication List    STOP taking these medications   NIFEdipine 30 MG 24 hr tablet Commonly known as: PROCARDIA-XL/NIFEDICAL-XL     TAKE these medications   albuterol 108 (90 Base) MCG/ACT inhaler Commonly known as: Proventil HFA Inhale 2 puffs into the lungs every 4 (four) hours  as needed for wheezing or shortness of breath. What changed: reasons to take this   Alphagan P 0.1 % Soln Generic drug: brimonidine Place 1 drop into both eyes 2 (two) times daily.   apixaban 5 MG Tabs tablet Commonly known as: Eliquis Take 1 tablet (5 mg total) by mouth 2 (two) times daily.   atorvastatin 40 MG tablet Commonly known as: LIPITOR Take 1 tablet (40 mg total) by mouth every evening. What changed:   medication strength  when to take this   carvedilol 12.5 MG tablet Commonly known as: COREG Take 1 tablet (12.5 mg total) by mouth 2 (two) times daily with a meal. What changed:   how much to take  when to take this   dorzolamide-timolol 22.3-6.8 MG/ML ophthalmic solution Commonly known as: COSOPT Place 1 drop into the right eye 2 (two) times daily.   furosemide 20 MG tablet Commonly known as: LASIX Take 20 mg by mouth daily.   hydrALAZINE 50 MG tablet Commonly known as: APRESOLINE  Take 1 tablet (50 mg total) by mouth 3 (three) times daily. What changed:   medication strength  how much to take  when to take this   isosorbide mononitrate 30 MG 24 hr tablet Commonly known as: IMDUR Take 1 tablet (30 mg total) by mouth daily.   labetalol 200 MG tablet Commonly known as: NORMODYNE Take 0.5 tablets (100 mg total) by mouth 2 (two) times daily.   latanoprost 0.005 % ophthalmic solution Commonly known as: XALATAN Place 1 drop into both eyes at bedtime.   losartan 50 MG tablet Commonly known as: COZAAR Take 1 tablet (50 mg total) by mouth daily.   ofloxacin 0.3 % ophthalmic solution Commonly known as: OCUFLOX Place 1 drop into the right eye 4 (four) times daily.   prednisoLONE acetate 1 % ophthalmic suspension Commonly known as: PRED FORTE Place 1 drop into the right eye 2 (two) times daily.   Symbicort 80-4.5 MCG/ACT inhaler Generic drug: budesonide-formoterol Inhale 2 puffs into the lungs 2 (two) times daily.   traZODone 50 MG tablet Commonly known as: DESYREL Take 1 tablet (50 mg total) by mouth at bedtime.       Major procedures and Radiology Reports - PLEASE review detailed and final reports for all details, in brief -     Mr Brain Wo Contrast  Result Date: 12/18/2018 CLINICAL DATA:  Numbness tingling paresthesia. Right temporal headache. Left arm numbness. EXAM: MRI HEAD WITHOUT CONTRAST TECHNIQUE: Multiplanar, multiecho pulse sequences of the brain and surrounding structures were obtained without intravenous contrast. COMPARISON:  CT head 12/18/2018.  MRI 08/31/2017 FINDINGS: Brain: Negative for acute infarct. Clusters of small hyperintensities in the parietal white matter bilaterally unchanged from prior MRI. Negative for hemorrhage or mass. No hydrocephalus. Vascular: Normal arterial flow voids. Skull and upper cervical spine: Chronic deformity of the C2 vertebral body. There appears to be a chronic fracture of the dens, similar to the prior  study. No spinal stenosis. Sinuses/Orbits: Paranasal sinuses clear.  Bilateral cataract surgery Other: None IMPRESSION: No acute intracranial abnormality. Mild chronic white matter changes stable from the prior MRI of 08/31/2017 Chronic fracture of the dens unchanged from prior study. Electronically Signed   By: Marlan Palau M.D.   On: 12/18/2018 12:17   Ct Head Code Stroke Wo Contrast  Result Date: 12/18/2018 CLINICAL DATA:  Code stroke. Right-sided headache with left arm numbness. EXAM: CT HEAD WITHOUT CONTRAST TECHNIQUE: Contiguous axial images were obtained from the base of the skull  through the vertex without intravenous contrast. COMPARISON:  CT head 08/31/2017 FINDINGS: Brain: No evidence of acute infarction, hemorrhage, hydrocephalus, extra-axial collection or mass lesion/mass effect. Vascular: Negative for hyperdense vessel. Atherosclerotic calcification in the cavernous carotid bilaterally. Skull: Negative Sinuses/Orbits: Paranasal sinuses clear.  Bilateral ocular surgery. Other: None ASPECTS ([place] Stroke Program Early CT Score) - Ganglionic level infarction (caudate, lentiform nuclei, internal capsule, insula, M1-M3 cortex): 7 - Supraganglionic infarction (M4-M6 cortex): 3 Total score (0-10 with 10 being normal): 10 IMPRESSION: 1. No acute intracranial abnormality 2. ASPECTS is 10 3. These results were called by telephone at the time of interpretation on 12/18/2018 at 9:04 am to provider [facility] , who verbally acknowledged these results. Electronically Signed   By: Marlan Palau M.D.   On: 12/18/2018 09:05    Micro Results   Recent Results (from the past 240 hour(s))  SARS Coronavirus 2 by RT PCR (hospital order, performed in [facility] hospital lab) Nasopharyngeal Nasopharyngeal Swab     Status: None   Collection Time: 12/18/18 10:03 AM   Specimen: Nasopharyngeal Swab  Result Value Ref Range Status   SARS Coronavirus 2 NEGATIVE NEGATIVE Final    Comment: (NOTE) If result is  NEGATIVE SARS-CoV-2 target nucleic acids are NOT DETECTED. The SARS-CoV-2 RNA is generally detectable in upper and lower  respiratory specimens during the acute phase of infection. The lowest  concentration of SARS-CoV-2 viral copies this assay can detect is 250  copies / mL. A negative result does not preclude SARS-CoV-2 infection  and should not be used as the sole basis for treatment or other  patient management decisions.  A negative result may occur with  improper specimen collection / handling, submission of specimen other  than nasopharyngeal swab, presence of viral mutation(s) within the  areas targeted by this assay, and inadequate number of viral copies  (<250 copies / mL). A negative result must be combined with clinical  observations, patient history, and epidemiological information. If result is POSITIVE SARS-CoV-2 target nucleic acids are DETECTED. The SARS-CoV-2 RNA is generally detectable in upper and lower  respiratory specimens dur ing the acute phase of infection.  Positive  results are indicative of active infection with SARS-CoV-2.  Clinical  correlation with patient history and other diagnostic information is  necessary to determine patient infection status.  Positive results do  not rule out bacterial infection or co-infection with other viruses. If result is PRESUMPTIVE POSTIVE SARS-CoV-2 nucleic acids MAY BE PRESENT.   A presumptive positive result was obtained on the submitted specimen  and confirmed on repeat testing.  While 2019 novel coronavirus  (SARS-CoV-2) nucleic acids may be present in the submitted sample  additional confirmatory testing may be necessary for epidemiological  and / or clinical management purposes  to differentiate between  SARS-CoV-2 and other Sarbecovirus currently known to infect humans.  If clinically indicated additional testing with an alternate test  methodology [phone]) is advised. The SARS-CoV-2 RNA is generally  detectable  in upper and lower respiratory sp ecimens during the acute  phase of infection. The expected result is Negative. Fact Sheet for Patients:  BoilerBrush.com.cy Fact Sheet for Healthcare Providers: https://pope.com/ This test is not yet approved or cleared by the [place] FDA and has been authorized for detection and/or diagnosis of SARS-CoV-2 by FDA under an Emergency Use Authorization (EUA).  This EUA will remain in effect (meaning this test can be used) for the duration of the COVID-19 declaration under Section 564(b)(1) of the Act, 21  U.S.C. section 360bbb-3(b)(1), unless the authorization is terminated or revoked sooner. Performed at [facility], [address] [place], [place] [address]     Today   Subjective    Dustin Fuller today has no new complaints, -58 year old with past medical history relevant for  chronic atrial fibrillation on anticoagulation with Eliquis, status post prior ablation, HLD, HTN , history of Bell's palsy as well as tobacco abuse and COPD, as well as prior history of cocaine abuse (currently drug-free), presents to ED with right-sided headaches and left-sided upper extremity numbness-- -patient woke up around 5:30 AM, he noticed the symptoms first around 7:45 AM -No chest pains no palpitations no dizziness, no pleuritic symptoms no leg swelling no leg pains  -No nausea no vomiting no diarrhea, -No fevers or chills -No speech or swallowing difficulties,  -EDP consulted telemetry neurologist recommended stroke work-up-  -Echocardiogram reassuring and largely unchanged from echo of 08/2017, EF is 55 to 60%, with diastolic dysfunction -Carotid artery Dopplers from 09/10/2017 without hemodynamically significant stenosis  -CT head and MRI brain without acute findings -COVID-19 negative -Blood alcohol level and UDS negative -CBC unremarkable -CMP unremarkable -UA unremarkable         Patient has been  seen and examined prior to discharge   Objective   Blood pressure (!) 166/92, pulse 61, temperature 97.7 F (36.5 C), temperature source Oral, resp. rate 15, height  (1.803 m), weight 105.9 kg, SpO2 98 %.   Intake/Output Summary (Last 24 hours) at 12/18/2018 1645 Last data filed at 12/18/2018 1502 Gross per 24 hour  Intake -  Output 200 ml  Net -200 ml    Exam  Gen:- Awake Alert, no acute distress  HEENT:- Colon.AT, No sclera icterus Neck-Supple Neck,No JVD,.  Lungs-  CTAB , good air movement bilaterally CV- S1, S2 normal, regular Abd-  +ve B.Sounds, Abd Soft, No tenderness,    Extremity/Skin:- No  edema,   good pulses Psych-affect is appropriate, oriented x3 Neuro-no new focal deficits, no tremors  -Neurologial Exam: Mental Status: Patient is awake, alert, oriented to person, place, month, year, and situation. Patient is able to give a clear and coherent history. No signs of aphasia or neglect Cranial Nerves: II: Visual Fields are full. Pupils are equal, round, and reactive to light.   III,IV, VI: EOMI without ptosis or diploplia.  V: Facial sensation is symmetrical and wnl VII: Facial movement is symmetric.  VIII: hearing is intact to voice X: Uvula elevates symmetrically XI: Shoulder shrug is symmetric. XII: tongue is midline without atrophy or fasciculations.  Motor: Tone is normal. Bulk is normal. 5/5 strength was present in all four extremities.  Sensory: Sensation is normal to pinprick and to touch Cerebellar: FNF without ataxia    Data Review   CBC w Diff:  Lab Results  Component Value Date   WBC 6.4 12/18/2018   HGB 15.3 12/18/2018   HCT 48.4 12/18/2018   PLT 189 12/18/2018   LYMPHOPCT 20 12/18/2018   MONOPCT 7 12/18/2018   EOSPCT 3 12/18/2018   BASOPCT 1 12/18/2018    CMP:  Lab Results  Component Value Date   NA 140 12/18/2018   K 4.1 12/18/2018   CL 102 12/18/2018   CO2 28 12/18/2018   BUN 17 12/18/2018   CREATININE 0.80 12/18/2018    PROT 7.7 12/18/2018   ALBUMIN 4.5 12/18/2018   BILITOT 0.8 12/18/2018   ALKPHOS 63 12/18/2018   AST 15 12/18/2018   ALT 20 12/18/2018   Marcelino Campos  Lizbet Cirrincione M.D on 12/18/2018 at 4:45 PM  Go to [email or web address] -  for contact info  [facility] - Office  [phone]

## 2019-01-05 ENCOUNTER — Ambulatory Visit: Payer: Medicaid Other | Admitting: Cardiology

## 2019-02-13 ENCOUNTER — Other Ambulatory Visit: Payer: Self-pay

## 2019-02-13 ENCOUNTER — Ambulatory Visit: Payer: Medicaid Other | Attending: Internal Medicine

## 2019-02-13 DIAGNOSIS — Z20822 Contact with and (suspected) exposure to covid-19: Secondary | ICD-10-CM

## 2019-02-15 LAB — NOVEL CORONAVIRUS, NAA: SARS-CoV-2, NAA: NOT DETECTED

## 2019-08-12 IMAGING — CT CT HEAD W/O CM
3 series · 16 of 47 positions shown, 19 images · non-contrast
Comparison: None.

CLINICAL DATA: Slurred speech beginning yesterday.

EXAM:
CT HEAD WITHOUT CONTRAST
TECHNIQUE: Contiguous axial images were obtained from the base of the skull
through the vertex without intravenous contrast.

[Series 2: head wo · axial · 0.43mm/px · z∈[+52,+182]mm · 10 of 32 slices shown, 13 images]
[im 3/32  brain]
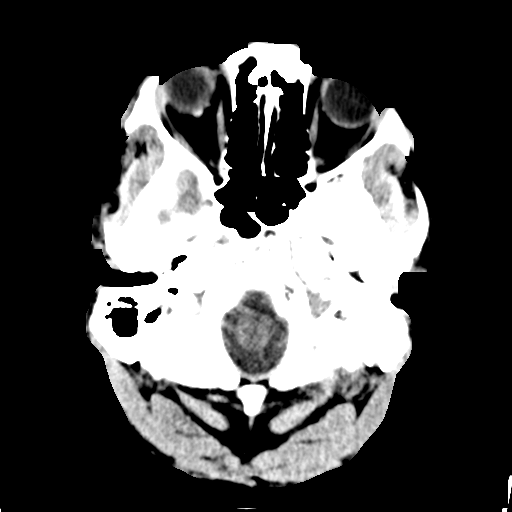
[im 3/32  bone]
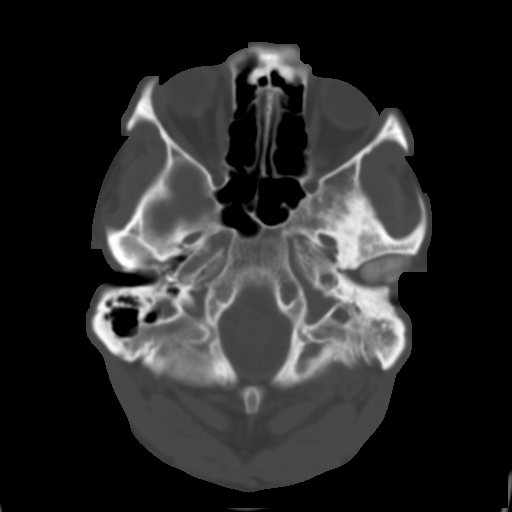
[im 6/32  brain]
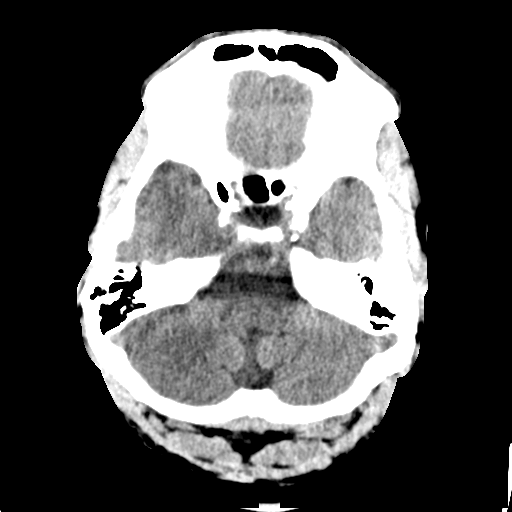
[im 9/32  brain]
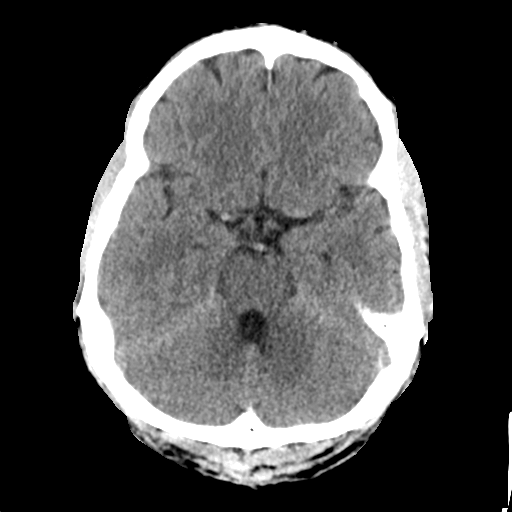
[im 11/32  brain]
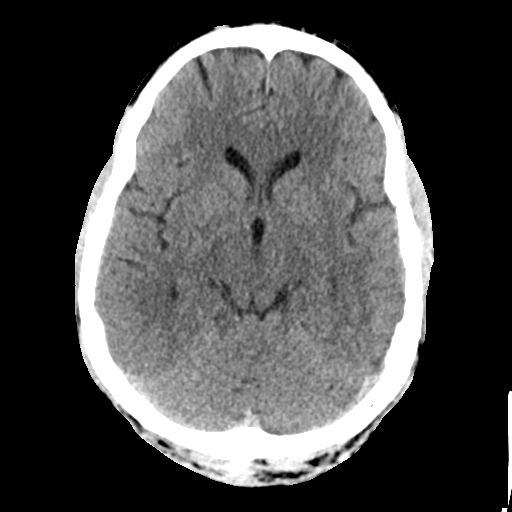
[im 14/32  brain]
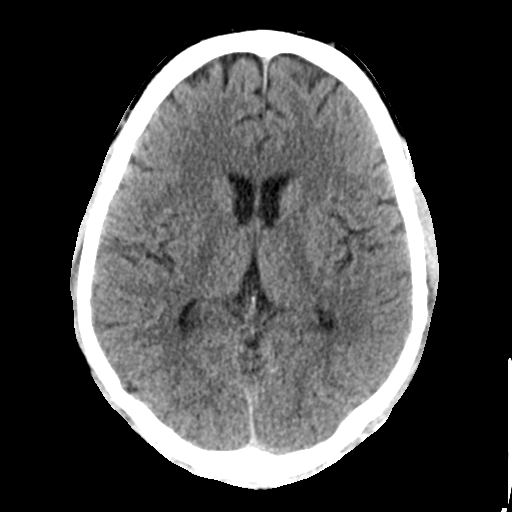
[im 14/32  bone]
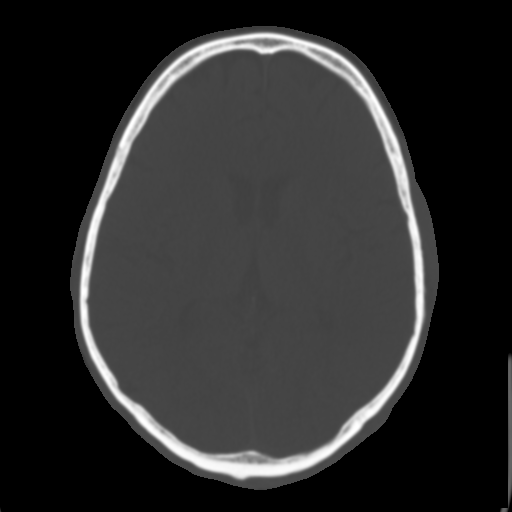
[im 18/32  brain]
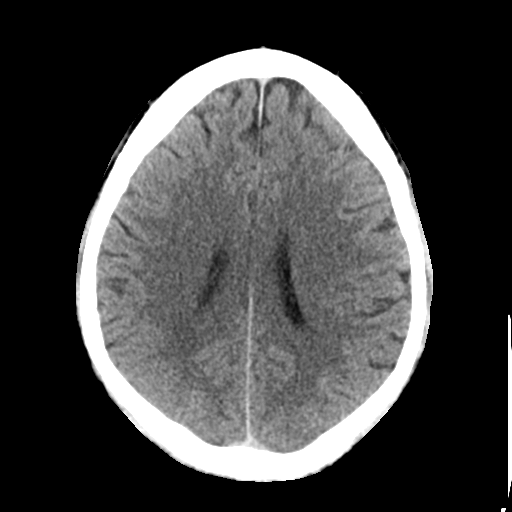
[im 21/32  brain]
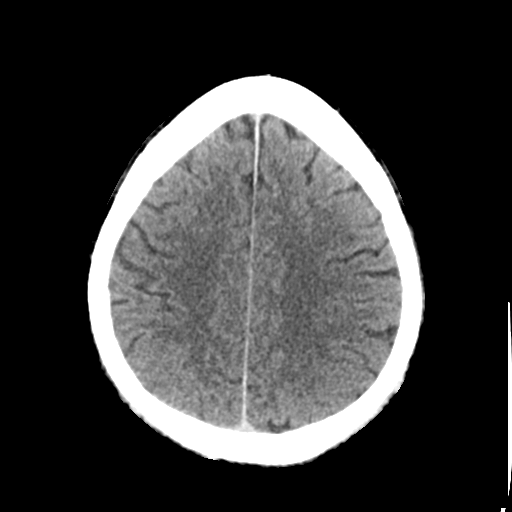
[im 24/32  brain]
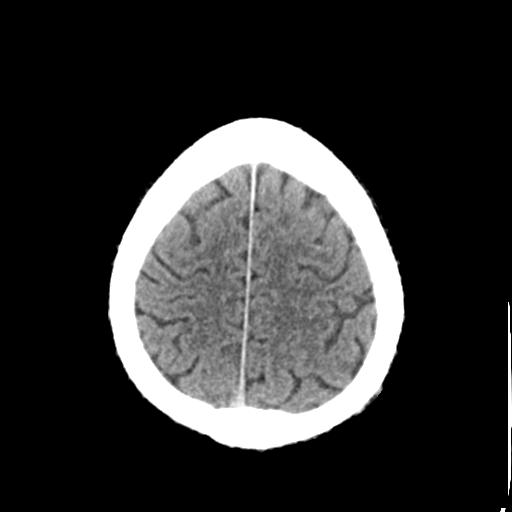
[im 26/32  brain]
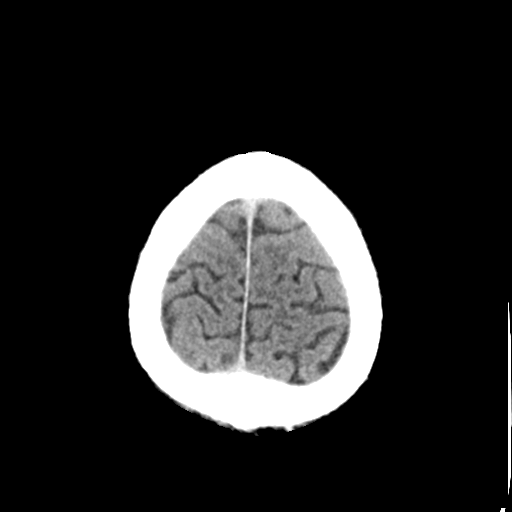
[im 26/32  bone]
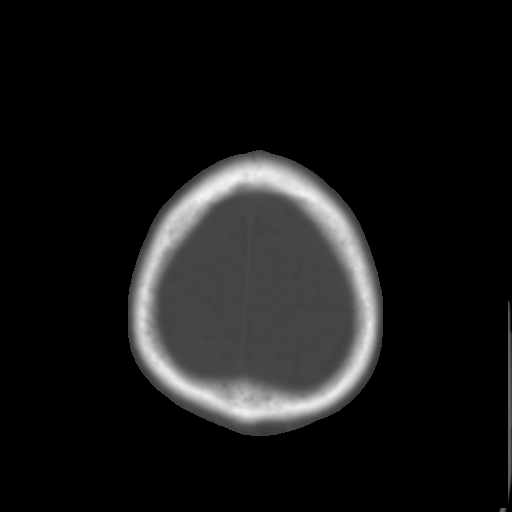
[im 29/32  brain]
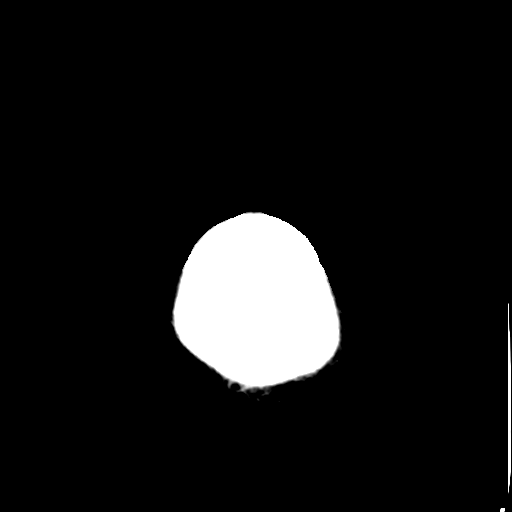

[Series 4: coronal soft tissue · coronal · 0.34mm/px · 3 of 68 slices shown]
[im 23/68  brain]
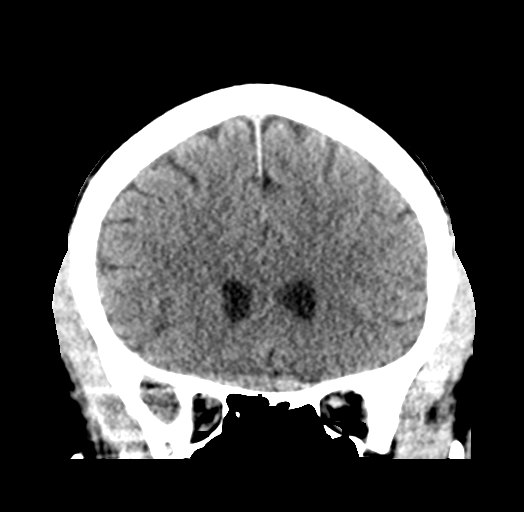
[im 30/68  brain]
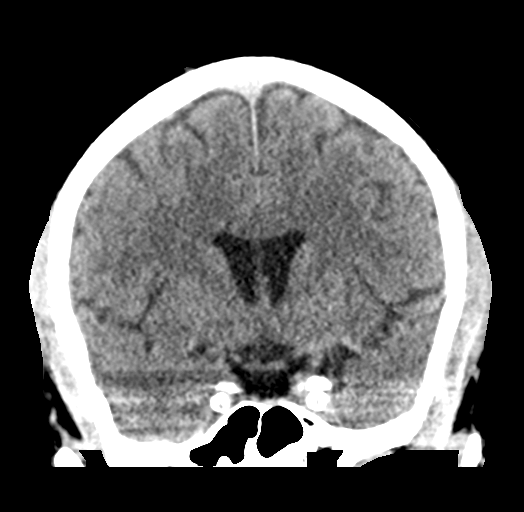
[im 38/68  brain]
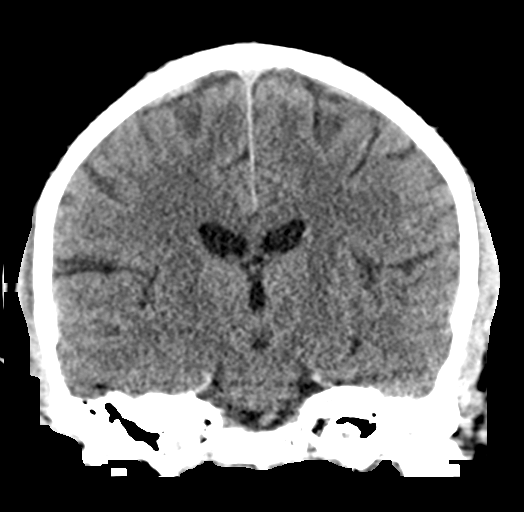

[Series 5: sagittal soft tissue · sagittal · 0.36mm/px · 3 of 66 slices shown]
[im 22/66  brain]
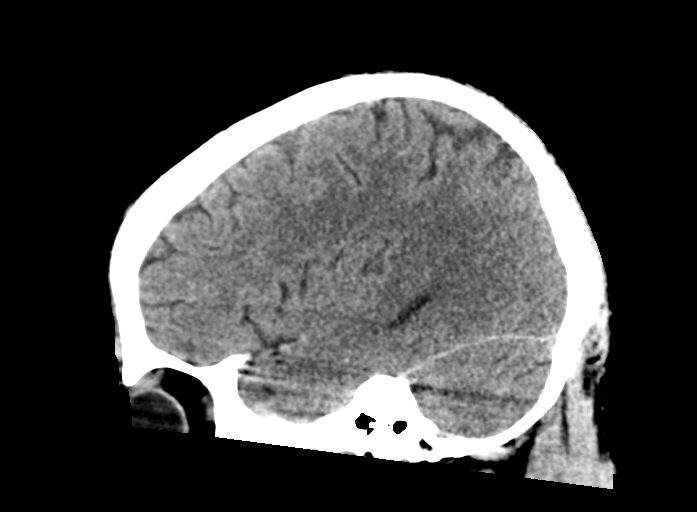
[im 33/66  brain]
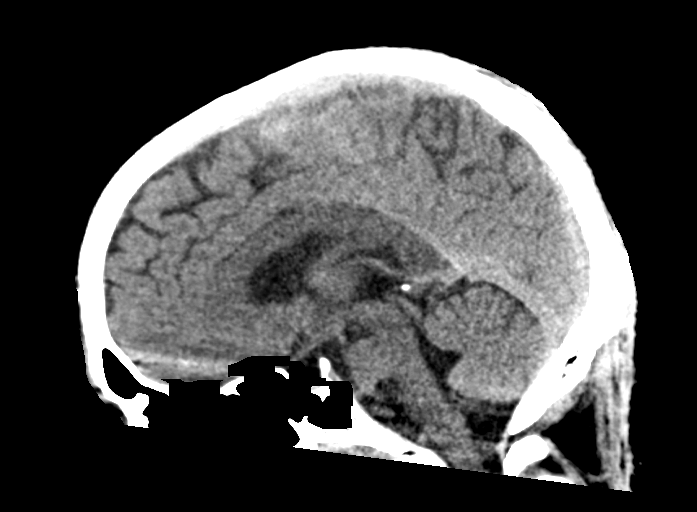
[im 44/66  brain]
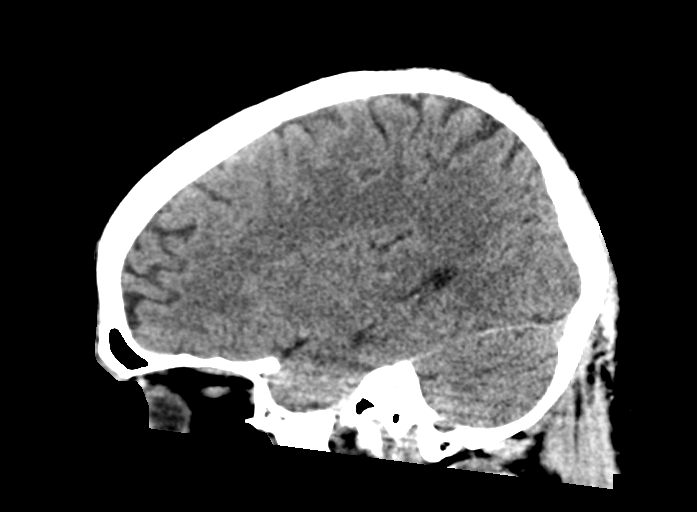

[16 of 47 positions shown; findings below may reference images not displayed]

FINDINGS: Brain: The brain shows a normal appearance without evidence of
malformation, atrophy, old or acute small or large vessel
infarction, mass lesion, hemorrhage, hydrocephalus or extra-axial
collection.

Vascular: There is atherosclerotic calcification of the major
vessels at the base of the brain.

Skull: Normal.  No traumatic finding.  No focal bone lesion.

Sinuses/Orbits: Sinuses are clear. Orbits appear normal. Mastoids
are clear.

Other: None significant
IMPRESSION: Normal head CT with exception of considerable atherosclerotic
calcification of the major vessels at the base of the brain.

## 2019-08-12 IMAGING — MR MR HEAD W/O CM
6 of 10 series · 30 of 48 positions shown · non-contrast
Comparison: 08/31/2017 CT head.

CLINICAL DATA: 57 y/o  M; right facial droop and slurred speech.

EXAM:
MRI HEAD WITHOUT CONTRAST
MRA HEAD WITHOUT CONTRAST
TECHNIQUE: Multiplanar, multiecho pulse sequences of the brain and surrounding
structures were obtained without intravenous contrast. Angiographic
images of the head were obtained using MRA technique without
contrast.

[Series 3: DWI · axial · 3.0mm · 0.78mm/px · z∈[-92,+69]mm · 7 of 55 slices shown (1 of 4)]
[im 1/55]
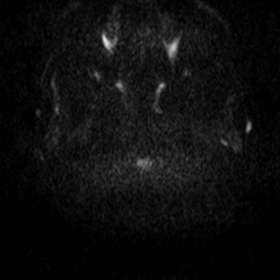
[im 10/55]
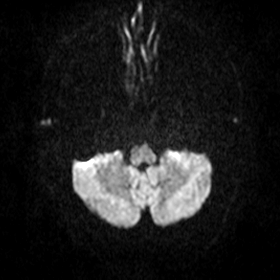
[im 19/55]
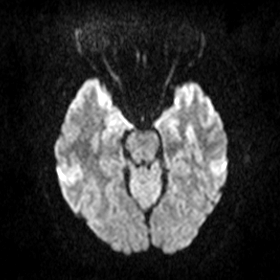
[im 28/55]
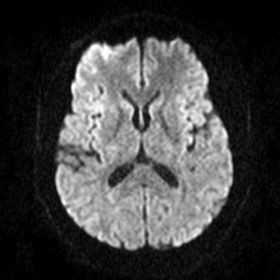
[im 37/55]
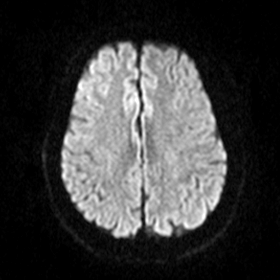
[im 46/55]
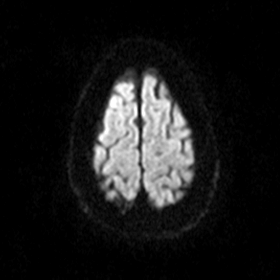
[im 55/55]
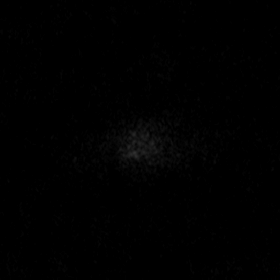

[Series 4: DWI · axial · 3.0mm · 0.78mm/px · z∈[-92,+69]mm · 7 of 55 slices shown (2 of 4)]
[im 1/55]
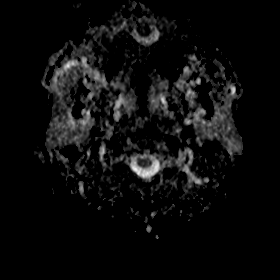
[im 10/55]
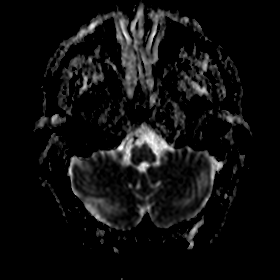
[im 19/55]
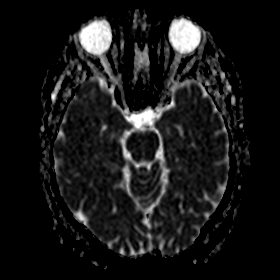
[im 28/55]
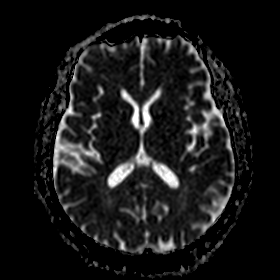
[im 37/55]
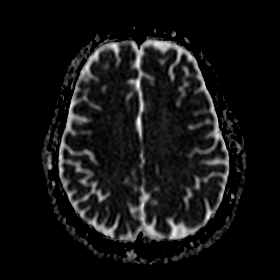
[im 46/55]
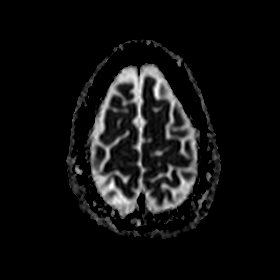
[im 55/55]
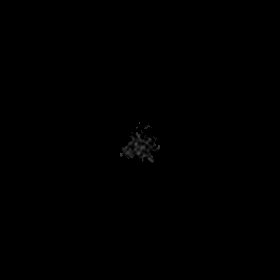

[Series 5: DWI · coronal · 5.0mm · 0.50mm/px · 4 of 38 slices shown (3 of 4)]
[im 1/38]
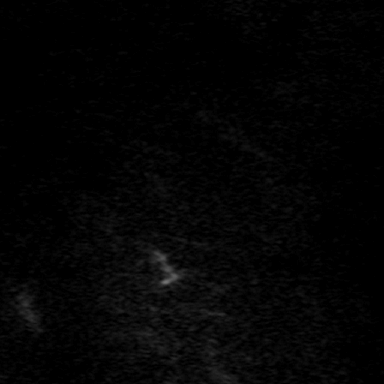
[im 13/38]
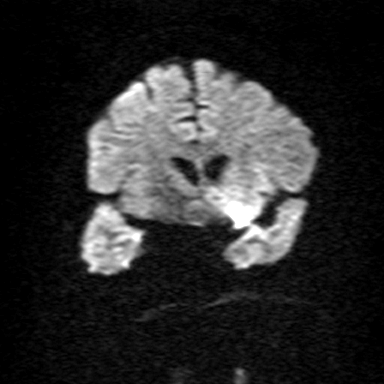
[im 25/38]
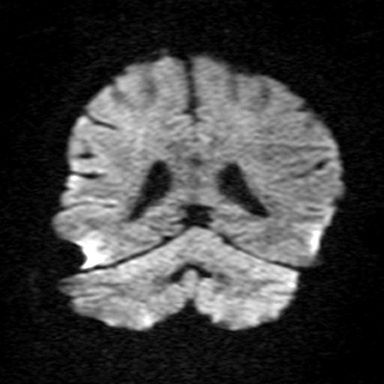
[im 38/38]
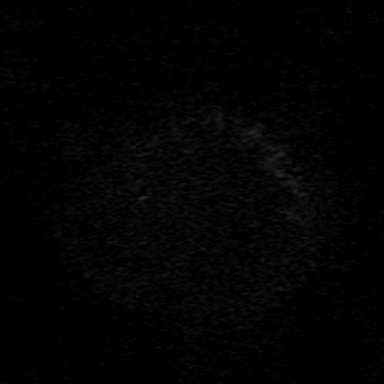

[Series 6: DWI · coronal · 5.0mm · 0.50mm/px · 4 of 38 slices shown (4 of 4)]
[im 1/38]
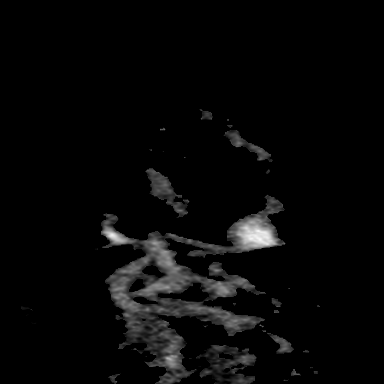
[im 13/38]
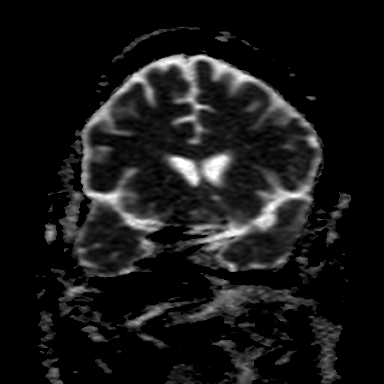
[im 25/38]
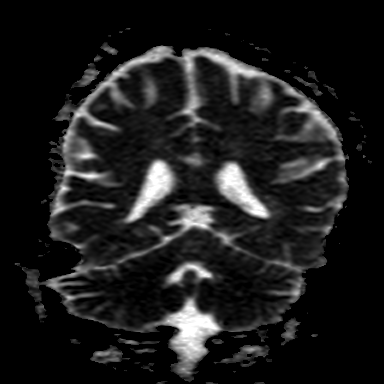
[im 38/38]
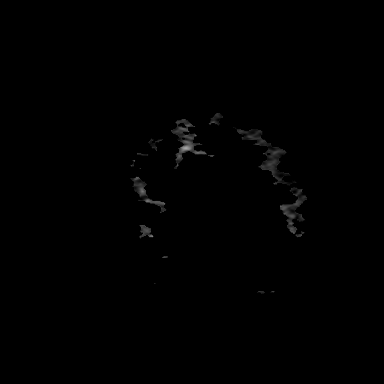

[Series 8: T2 · axial · 5.0mm · 0.53mm/px · z∈[-87,-16]mm · 2 of 23 slices shown]
[im 1/23]
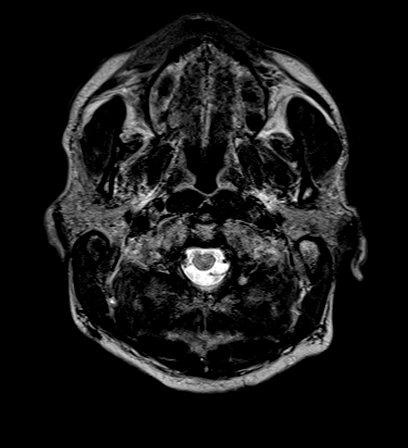
[im 12/23]
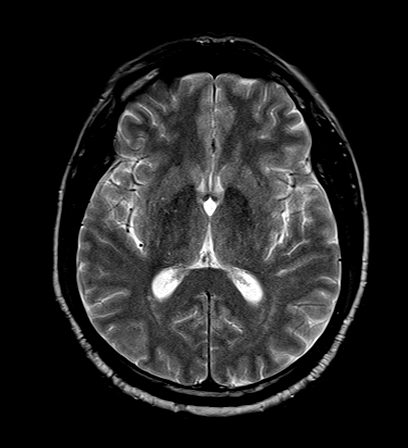

[Series 9: FLAIR · axial · 3.0mm · 0.35mm/px · z∈[-91,+61]mm · 6 of 52 slices shown]
[im 1/52]
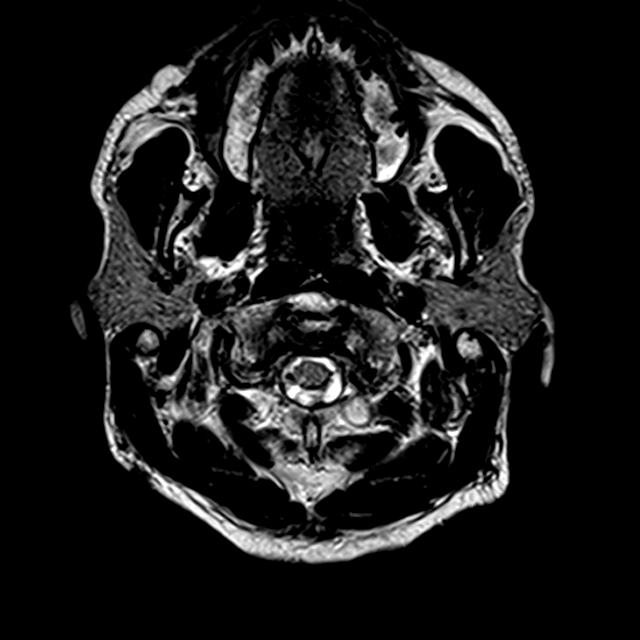
[im 11/52]
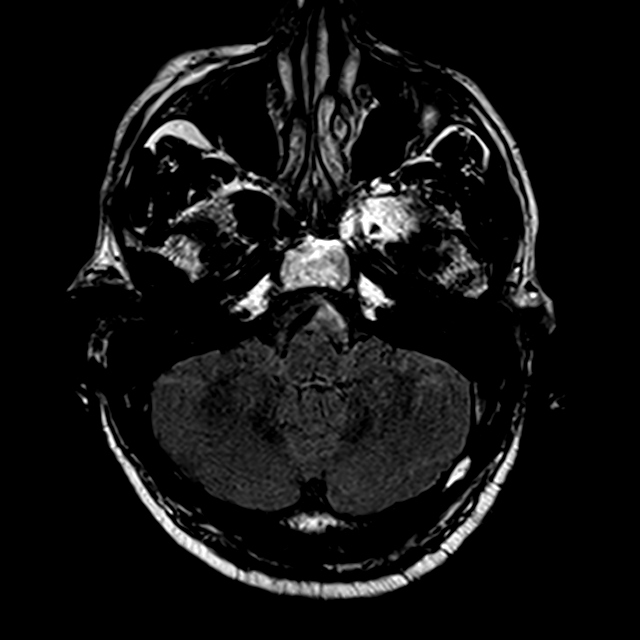
[im 21/52]
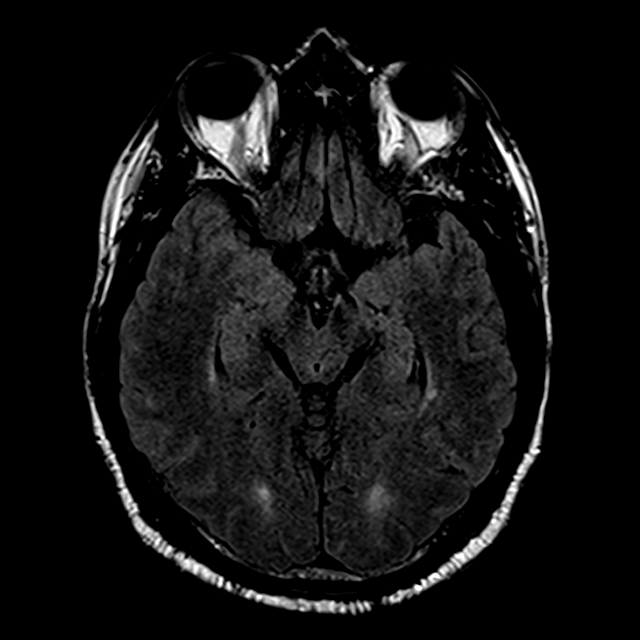
[im 31/52]
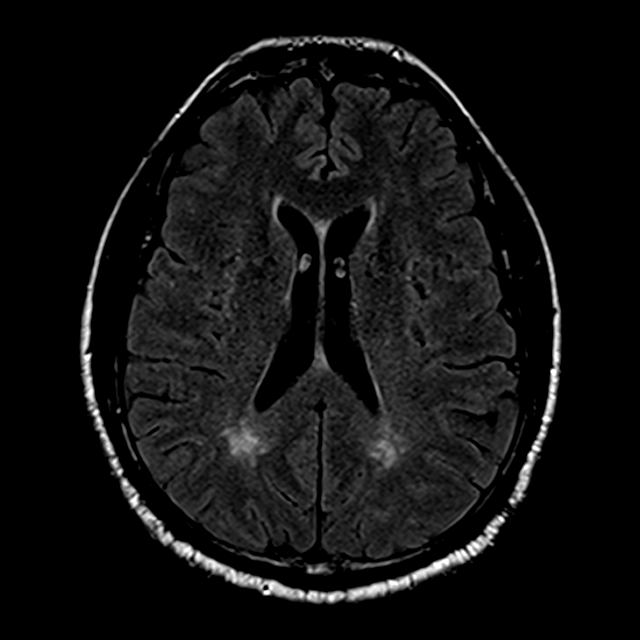
[im 41/52]
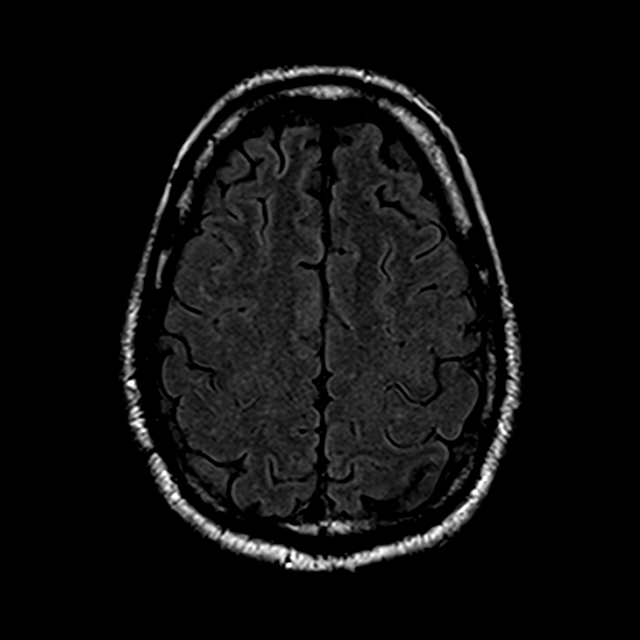
[im 52/52]
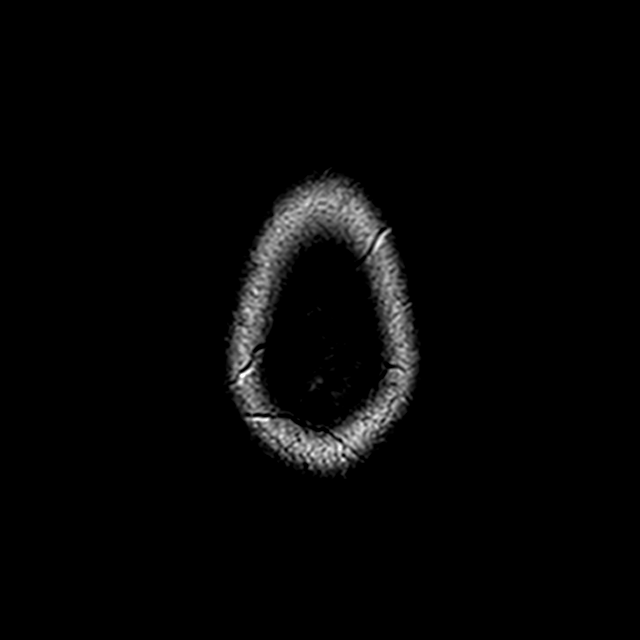

[30 of 48 positions shown; findings below may reference images not displayed]

FINDINGS: MRI HEAD FINDINGS

Brain: No acute infarction, hemorrhage, hydrocephalus, extra-axial
collection or mass lesion. Several nonspecific foci of T2 FLAIR
hyperintense signal abnormality in periventricular white matter are
compatible with mild chronic microvascular ischemic changes for age.

Vascular: As below.

Skull and upper cervical spine: Normal marrow signal.

Sinuses/Orbits: Negative.

Other: None.

MRA HEAD FINDINGS

Anterior circulation: No large vessel occlusion, aneurysm, or
significant stenosis is identified.

Posterior circulation: No large vessel occlusion, aneurysm, or
significant stenosis is identified.

Anatomic variant: No communicating artery identified, likely
hypoplastic or absent.
IMPRESSION: 1. No acute intracranial abnormality identified.
2. Mild chronic microvascular ischemic changes of the brain.
3. Normal MRA of the head.

By: Frita Fai M.D.

## 2020-10-17 ENCOUNTER — Encounter (HOSPITAL_COMMUNITY): Payer: Self-pay | Admitting: *Deleted

## 2020-10-17 ENCOUNTER — Observation Stay (HOSPITAL_COMMUNITY): Payer: Medicaid Other

## 2020-10-17 ENCOUNTER — Emergency Department (HOSPITAL_COMMUNITY): Payer: Medicaid Other

## 2020-10-17 ENCOUNTER — Encounter: Payer: Self-pay | Admitting: Emergency Medicine

## 2020-10-17 ENCOUNTER — Observation Stay (HOSPITAL_COMMUNITY)
Admission: EM | Admit: 2020-10-17 | Discharge: 2020-10-18 | Disposition: A | Discharge status: Home / Self Care | Payer: Medicaid Other | Attending: Family Medicine | Admitting: Family Medicine

## 2020-10-17 ENCOUNTER — Other Ambulatory Visit: Payer: Self-pay

## 2020-10-17 ENCOUNTER — Ambulatory Visit
Admission: EM | Admit: 2020-10-17 | Discharge: 2020-10-17 | Disposition: A | Discharge status: Home / Self Care | Payer: Medicaid Other

## 2020-10-17 DIAGNOSIS — I480 Paroxysmal atrial fibrillation: Secondary | ICD-10-CM | POA: Insufficient documentation

## 2020-10-17 DIAGNOSIS — J449 Chronic obstructive pulmonary disease, unspecified: Secondary | ICD-10-CM | POA: Diagnosis not present

## 2020-10-17 DIAGNOSIS — I10 Essential (primary) hypertension: Secondary | ICD-10-CM | POA: Insufficient documentation

## 2020-10-17 DIAGNOSIS — F17210 Nicotine dependence, cigarettes, uncomplicated: Secondary | ICD-10-CM | POA: Diagnosis not present

## 2020-10-17 DIAGNOSIS — R079 Chest pain, unspecified: Secondary | ICD-10-CM | POA: Diagnosis present

## 2020-10-17 DIAGNOSIS — I160 Hypertensive urgency: Secondary | ICD-10-CM | POA: Diagnosis not present

## 2020-10-17 DIAGNOSIS — Z7901 Long term (current) use of anticoagulants: Secondary | ICD-10-CM | POA: Insufficient documentation

## 2020-10-17 DIAGNOSIS — R200 Anesthesia of skin: Secondary | ICD-10-CM | POA: Insufficient documentation

## 2020-10-17 DIAGNOSIS — Z79899 Other long term (current) drug therapy: Secondary | ICD-10-CM | POA: Diagnosis not present

## 2020-10-17 DIAGNOSIS — Z8673 Personal history of transient ischemic attack (TIA), and cerebral infarction without residual deficits: Secondary | ICD-10-CM | POA: Insufficient documentation

## 2020-10-17 DIAGNOSIS — Z20822 Contact with and (suspected) exposure to covid-19: Secondary | ICD-10-CM | POA: Insufficient documentation

## 2020-10-17 DIAGNOSIS — I169 Hypertensive crisis, unspecified: Secondary | ICD-10-CM | POA: Diagnosis present

## 2020-10-17 DIAGNOSIS — I639 Cerebral infarction, unspecified: Secondary | ICD-10-CM | POA: Insufficient documentation

## 2020-10-17 LAB — BASIC METABOLIC PANEL
Anion gap: 6 (ref 5–15)
BUN: 14 mg/dL (ref 6–20)
CO2: 25 mmol/L (ref 22–32)
Calcium: 8.7 mg/dL — ABNORMAL LOW (ref 8.9–10.3)
Chloride: 106 mmol/L (ref 98–111)
Creatinine, Ser: 0.76 mg/dL (ref 0.61–1.24)
GFR, Estimated: >60 mL/min (ref >60)
Glucose, Bld: 88 mg/dL (ref 70–99)
Potassium: 3.9 mmol/L (ref 3.5–5.1)
Sodium: 137 mmol/L (ref 135–145)

## 2020-10-17 LAB — RAPID URINE DRUG SCREEN, HOSP PERFORMED
Amphetamines: NOT DETECTED
Barbiturates: NOT DETECTED
Benzodiazepines: NOT DETECTED
Cocaine: NOT DETECTED
Opiates: NOT DETECTED
Tetrahydrocannabinol: NOT DETECTED

## 2020-10-17 LAB — TROPONIN I (HIGH SENSITIVITY)
Troponin I (High Sensitivity): 3 ng/L (ref <18)
Troponin I (High Sensitivity): 3 ng/L (ref <18)

## 2020-10-17 LAB — CBC
HCT: 44.9 % (ref 39.0–52.0)
Hemoglobin: 14.8 g/dL (ref 13.0–17.0)
MCH: 31.1 pg (ref 26.0–34.0)
MCHC: 33 g/dL (ref 30.0–36.0)
MCV: 94.3 fL (ref 80.0–100.0)
Platelets: 200 10*3/uL (ref 150–400)
RBC: 4.76 MIL/uL (ref 4.22–5.81)
RDW: 13.4 % (ref 11.5–15.5)
WBC: 5.2 10*3/uL (ref 4.0–10.5)
nRBC: 0 % (ref 0.0–0.2)

## 2020-10-17 LAB — RESP PANEL BY RT-PCR (FLU A&B, COVID) ARPGX2
Influenza A by PCR: NEGATIVE
Influenza B by PCR: NEGATIVE
SARS Coronavirus 2 by RT PCR: NEGATIVE

## 2020-10-17 LAB — D-DIMER, QUANTITATIVE: D-Dimer, Quant: <0.27 ug/mL-FEU (ref 0.00–0.50)

## 2020-10-17 LAB — HIV ANTIBODY (ROUTINE TESTING W REFLEX): HIV Screen 4th Generation wRfx: NONREACTIVE

## 2020-10-17 MED ORDER — LABETALOL HCL 5 MG/ML IV SOLN
10.0000 mg | Freq: Once | INTRAVENOUS | Status: AC
  Administered 2020-10-17: 10 mg via INTRAVENOUS
  Filled 2020-10-17: qty 4

## 2020-10-17 MED ORDER — CARVEDILOL 12.5 MG PO TABS
25.0000 mg | ORAL_TABLET | Freq: Two times a day (BID) | ORAL | Status: DC
  Administered 2020-10-17 – 2020-10-18 (×2): 25 mg via ORAL
  Filled 2020-10-17 (×2): qty 2

## 2020-10-17 MED ORDER — LABETALOL HCL 200 MG PO TABS: 100.0000 mg | ORAL_TABLET | Freq: Two times a day (BID) | ORAL | Status: DC

## 2020-10-17 MED ORDER — ALBUTEROL SULFATE (2.5 MG/3ML) 0.083% IN NEBU: 3.0000 mL | INHALATION_SOLUTION | RESPIRATORY_TRACT | Status: DC | PRN

## 2020-10-17 MED ORDER — ACETAMINOPHEN 650 MG RE SUPP: 650.0000 mg | Freq: Four times a day (QID) | RECTAL | Status: DC | PRN

## 2020-10-17 MED ORDER — APIXABAN 5 MG PO TABS
5.0000 mg | ORAL_TABLET | Freq: Two times a day (BID) | ORAL | Status: DC
  Administered 2020-10-17 – 2020-10-18 (×2): 5 mg via ORAL
  Filled 2020-10-17 (×2): qty 1

## 2020-10-17 MED ORDER — ATORVASTATIN CALCIUM 40 MG PO TABS: 40.0000 mg | ORAL_TABLET | Freq: Every evening | ORAL | Status: DC

## 2020-10-17 MED ORDER — ISOSORBIDE MONONITRATE ER 60 MG PO TB24
30.0000 mg | ORAL_TABLET | Freq: Every day | ORAL | Status: DC
  Administered 2020-10-18: 30 mg via ORAL
  Filled 2020-10-17: qty 1

## 2020-10-17 MED ORDER — SODIUM CHLORIDE 0.9% FLUSH: 3.0000 mL | INTRAVENOUS | Status: DC | PRN

## 2020-10-17 MED ORDER — ONDANSETRON HCL 4 MG/2ML IJ SOLN: 4.0000 mg | Freq: Four times a day (QID) | INTRAMUSCULAR | Status: DC | PRN

## 2020-10-17 MED ORDER — SODIUM CHLORIDE 0.9 % IV SOLN: 250.0000 mL | INTRAVENOUS | Status: DC | PRN

## 2020-10-17 MED ORDER — LABETALOL HCL 5 MG/ML IV SOLN: 10.0000 mg | INTRAVENOUS | Status: DC | PRN

## 2020-10-17 MED ORDER — ACETAMINOPHEN 325 MG PO TABS: 650.0000 mg | ORAL_TABLET | Freq: Four times a day (QID) | ORAL | Status: DC | PRN

## 2020-10-17 MED ORDER — HYDRALAZINE HCL 25 MG PO TABS
50.0000 mg | ORAL_TABLET | Freq: Three times a day (TID) | ORAL | Status: DC
  Administered 2020-10-17 – 2020-10-18 (×3): 50 mg via ORAL
  Filled 2020-10-17 (×4): qty 2

## 2020-10-17 MED ORDER — MOMETASONE FURO-FORMOTEROL FUM 100-5 MCG/ACT IN AERO
2.0000 | INHALATION_SPRAY | Freq: Two times a day (BID) | RESPIRATORY_TRACT | Status: DC
Start: 2020-10-17 — End: 2020-10-18
  Administered 2020-10-17 – 2020-10-18 (×2): 2 via RESPIRATORY_TRACT
  Filled 2020-10-17: qty 8.8

## 2020-10-17 MED ORDER — ONDANSETRON HCL 4 MG PO TABS: 4.0000 mg | ORAL_TABLET | Freq: Four times a day (QID) | ORAL | Status: DC | PRN

## 2020-10-17 MED ORDER — FUROSEMIDE 20 MG PO TABS
20.0000 mg | ORAL_TABLET | Freq: Every day | ORAL | Status: DC
  Administered 2020-10-18: 20 mg via ORAL
  Filled 2020-10-17: qty 1

## 2020-10-17 MED ORDER — LOSARTAN POTASSIUM 50 MG PO TABS
50.0000 mg | ORAL_TABLET | Freq: Every day | ORAL | Status: DC
  Administered 2020-10-18: 50 mg via ORAL
  Filled 2020-10-17: qty 1

## 2020-10-17 MED ORDER — SODIUM CHLORIDE 0.9% FLUSH
3.0000 mL | Freq: Two times a day (BID) | INTRAVENOUS | Status: DC
  Administered 2020-10-17 – 2020-10-18 (×2): 3 mL via INTRAVENOUS

## 2020-10-17 NOTE — ED Notes (Signed)
Patient transported to MRI 

## 2020-10-17 NOTE — ED Triage Notes (Addendum)
Pt brought in by [facility] from Urgent Care with c/o chest pain x 1 week with left arm radiation, headache x 2 weeks, blurry vision and diaphoresis yesterday, fatigue and SOB upon exertion. Pain worse with inspiration. EMS noted expiratory wheezing. 20g inserted by Urgent Care. Pt reports he has been at the [facility] for rehab and recovery x 6 weeks and he hasn't been given his cholesterol meds since being there.

## 2020-10-17 NOTE — H&P (Addendum)
History and Physical    Dustin Fuller EXN:[id number] DOB: [birth year] DOA: 10/17/2020  PCP: Pcp, No   Patient coming from: Home  Chief Complaint: Chest pain/HA  HPI: Dustin Fuller is a 60 y.o. male with medical history significant for poorly-controlled hypertension, atrial fibrillation with prior ablation-currently on Eliquis, prior transient neurological symptoms, ongoing tobacco abuse, Bell's palsy, and glaucoma who presented to the ED with ongoing left-sided chest pain that appears to have been ongoing for the last 1-2 weeks.  He states that the pain is fairly constant but is exacerbated with activity and with coughing.  He states that he also has some numbness to his left arm and some trouble with his speech recently, but he is unsure when this began.  He states that he was hospitalized about a month ago for COVID and was discharged with home blood pressure medications to include Coreg, Lasix, and hydralazine.  He ran out of his hydralazine 2 days ago and apparently has not been taking his previously prescribed Imdur or losartan.  He has also been off of his cholesterol medication because he was told not to take it on discharge.  He checks his blood pressures intermittently at home and states that they are usually very high and have been on the higher side in the last 1-2 weeks.  He denies any diaphoresis, shortness of breath, nausea, vomiting, lower extremity edema, orthopnea, nausea, vomiting, diarrhea, or abdominal pain.   ED Course: Vital signs show blood pressures to be elevated approximately 170/100 at bedside.  EDP will order labetalol.  COVID testing negative and urine drug screen negative for cocaine.  CT head with no acute findings and chest x-ray with no acute findings.  EKG with no signs of significant changes.  Initial troponin within normal limits.  Review of Systems: Reviewed as noted above, otherwise negative.  Past Medical History:  Diagnosis Date   Bell's palsy    COPD  (chronic obstructive pulmonary disease) (HCC)    Essential hypertension    Glaucoma    History of atrial fibrillation    Reported ablation - [facility]   History of cocaine abuse (HCC)    Mixed hyperlipidemia    Stroke (HCC) 2019    Past Surgical History:  Procedure Laterality Date   CATARACT EXTRACTION     both eyes   glacoma     right eye     reports that he has been smoking cigarettes. He has been smoking an average of .25 packs per day. He has never used smokeless tobacco. He reports that he does not currently use alcohol. He reports that he does not currently use drugs after having used the following drugs: Cocaine.  Allergies  Allergen Reactions   Diltiazem Other (See Comments)    IV   Penicillins Other (See Comments)   Tiotropium Bromide Monohydrate Other (See Comments)   Lisinopril     Family History  Problem Relation Age of Onset   Cancer Mother     Prior to Admission medications   Medication Sig Start Date End Date Taking? Authorizing Provider  albuterol (PROVENTIL HFA) 108 (90 Base) MCG/ACT inhaler Inhale 2 puffs into the lungs every 4 (four) hours as needed for wheezing or shortness of breath. 12/18/18  Yes Emokpae, Courage, MD  apixaban (ELIQUIS) 5 MG TABS tablet Take 1 tablet (5 mg total) by mouth 2 (two) times daily. 12/18/18  Yes Emokpae, Courage, MD  carvedilol (COREG) 12.5 MG tablet Take 1 tablet (12.5 mg total) by mouth 2 (  two) times daily with a meal. Patient taking differently: Take 25 mg by mouth 2 (two) times daily with a meal. 12/18/18  Yes Emokpae, Courage, MD  furosemide (LASIX) 20 MG tablet Take 20 mg by mouth daily.  08/15/17  Yes [provider]  hydrALAZINE (APRESOLINE) 50 MG tablet Take 1 tablet (50 mg total) by mouth 3 (three) times daily. 12/18/18  Yes Emokpae, Courage, MD  labetalol (NORMODYNE) 200 MG tablet Take 0.5 tablets (100 mg total) by mouth 2 (two) times daily. 12/18/18  Yes Shon Hale, MD  SYMBICORT 80-4.5 MCG/ACT  inhaler Inhale 2 puffs into the lungs 2 (two) times daily. 12/18/18  Yes Emokpae, Courage, MD  ALPHAGAN P 0.1 % SOLN Place 1 drop into both eyes 2 (two) times daily. Patient not taking: Reported on 10/17/2020 12/18/18   Shon Hale, MD  atorvastatin (LIPITOR) 40 MG tablet Take 1 tablet (40 mg total) by mouth every evening. Patient not taking: Reported on 10/17/2020 12/18/18   Shon Hale, MD  dorzolamide-timolol (COSOPT) 22.3-6.8 MG/ML ophthalmic solution Place 1 drop into the right eye 2 (two) times daily. Patient not taking: Reported on 10/17/2020 12/18/18   Shon Hale, MD  isosorbide mononitrate (IMDUR) 30 MG 24 hr tablet Take 1 tablet (30 mg total) by mouth daily. 12/18/18 12/18/19  Shon Hale, MD  latanoprost (XALATAN) 0.005 % ophthalmic solution Place 1 drop into both eyes at bedtime. Patient not taking: Reported on 10/17/2020 12/18/18   Shon Hale, MD  losartan (COZAAR) 50 MG tablet Take 1 tablet (50 mg total) by mouth daily. Patient not taking: Reported on 10/17/2020 12/18/18   Shon Hale, MD  ofloxacin (OCUFLOX) 0.3 % ophthalmic solution Place 1 drop into the right eye 4 (four) times daily. Patient not taking: Reported on 10/17/2020 12/18/18   Shon Hale, MD  prednisoLONE acetate (PRED FORTE) 1 % ophthalmic suspension Place 1 drop into the right eye 2 (two) times daily. Patient not taking: Reported on 10/17/2020 12/18/18   Shon Hale, MD  traZODone (DESYREL) 50 MG tablet Take 1 tablet (50 mg total) by mouth at bedtime. Patient not taking: Reported on 10/17/2020 12/18/18   Shon Hale, MD    Physical Exam: Vitals:   10/17/20 1200 10/17/20 1230 10/17/20 1300 10/17/20 1330  BP: (!) 181/101 (!) 160/100 (!) 188/96 (!) 178/106  Pulse: 63 68 64 67  Resp: 19 14 20  (!) 21  Temp:      TempSrc:      SpO2: 99% 99% 100% 98%  Weight:      Height:        Constitutional: NAD, calm, comfortable Vitals:   10/17/20 1200 10/17/20 1230 10/17/20 1300  10/17/20 1330  BP: (!) 181/101 (!) 160/100 (!) 188/96 (!) 178/106  Pulse: 63 68 64 67  Resp: 19 14 20  (!) 21  Temp:      TempSrc:      SpO2: 99% 99% 100% 98%  Weight:      Height:       Eyes: lids and conjunctivae normal Neck: normal, supple Respiratory: clear to auscultation bilaterally. Normal respiratory effort. No accessory muscle use.  Cardiovascular: Regular rate and rhythm, no murmurs. Abdomen: no tenderness, no distention. Bowel sounds positive.  Musculoskeletal:  No edema. Skin: no rashes, lesions, ulcers.  Psychiatric: Flat affect  Labs on Admission: I have personally reviewed following labs and imaging studies  CBC: Recent Labs  Lab 10/17/20 1112  WBC 5.2  HGB 14.8  HCT 44.9  MCV 94.3  PLT 200  Basic Metabolic Panel: Recent Labs  Lab 10/17/20 1112  NA 137  K 3.9  CL 106  CO2 25  GLUCOSE 88  BUN 14  CREATININE 0.76  CALCIUM 8.7*   GFR: Estimated Creatinine Clearance: 122.9 mL/min (by C-G formula based on SCr of 0.76 mg/dL). Liver Function Tests: No results for input(s): AST, ALT, ALKPHOS, BILITOT, PROT, ALBUMIN in the last 168 hours. No results for input(s): LIPASE, AMYLASE in the last 168 hours. No results for input(s): AMMONIA in the last 168 hours. Coagulation Profile: No results for input(s): INR, PROTIME in the last 168 hours. Cardiac Enzymes: No results for input(s): CKTOTAL, CKMB, CKMBINDEX, TROPONINI in the last 168 hours. BNP (last 3 results) No results for input(s): PROBNP in the last 8760 hours. HbA1C: No results for input(s): HGBA1C in the last 72 hours. CBG: No results for input(s): GLUCAP in the last 168 hours. Lipid Profile: No results for input(s): CHOL, HDL, LDLCALC, TRIG, CHOLHDL, LDLDIRECT in the last 72 hours. Thyroid Function Tests: No results for input(s): TSH, T4TOTAL, FREET4, T3FREE, THYROIDAB in the last 72 hours. Anemia Panel: No results for input(s): VITAMINB12, FOLATE, FERRITIN, TIBC, IRON, RETICCTPCT in the last  72 hours. Urine analysis:    Component Value Date/Time   COLORURINE STRAW (A) 12/18/2018 0853   APPEARANCEUR CLEAR 12/18/2018 0853   LABSPEC 1.005 12/18/2018 0853   PHURINE 5.0 12/18/2018 0853   GLUCOSEU NEGATIVE 12/18/2018 0853   HGBUR NEGATIVE 12/18/2018 0853   BILIRUBINUR NEGATIVE 12/18/2018 0853   KETONESUR NEGATIVE 12/18/2018 0853   PROTEINUR NEGATIVE 12/18/2018 0853   NITRITE NEGATIVE 12/18/2018 0853   LEUKOCYTESUR NEGATIVE 12/18/2018 0853    Radiological Exams on Admission: CT HEAD WO CONTRAST ( )  Result Date: 10/17/2020 CLINICAL DATA:  Headache EXAM: CT HEAD WITHOUT CONTRAST TECHNIQUE: Contiguous axial images were obtained from the base of the skull through the vertex without intravenous contrast. COMPARISON:  Brain MRI 12/18/2018, CT head 12/18/2018 FINDINGS: Brain: There is no evidence of acute intracranial hemorrhage, extra-axial fluid collection, or infarct. The parenchyma is normal in appearance. The ventricles are nonenlarged. There is no mass lesion. There is no midline shift. Vascular: There is calcification of the bilateral cavernous ICAs. Skull: Normal. Negative for fracture or focal lesion. Sinuses/Orbits: The imaged paranasal sinuses and mastoid air cells are clear. Bilateral lens implants are in place. The globes and orbits are otherwise unremarkable. Other: None. IMPRESSION: No acute intracranial pathology. Electronically Signed   By: Lesia Hausen M.D.   On: 10/17/2020 12:07   DG Chest Portable 1 View  Result Date: 10/17/2020 CLINICAL DATA:  60 year old male with chest pain. EXAM: PORTABLE CHEST - 1 VIEW COMPARISON:  None available. FINDINGS: The mediastinal contours are within normal limits. No cardiomegaly. The lungs are clear bilaterally without evidence of focal consolidation, pleural effusion, or pneumothorax. No acute osseous abnormality. IMPRESSION: No acute cardiopulmonary process. Electronically Signed   By: Marliss Coots M.D.   On: 10/17/2020 11:44    EKG:  Independently reviewed. SR 61bpm.  Assessment/Plan Active Problems:   Hypertensive crisis    Hypertensive crisis -Symptomatic with chest pain and headache secondarily to this -Plan to control blood pressures with labetalol as needed -Continue home blood pressure medications and restart previous doses of Imdur and losartan, he will need prescriptions for this as well as hydralazine on discharge -Plan to rule out CVA with brain MRI -Continue monitor troponin and telemetry-highly doubtful this is ACS.  Plan to check 2D echocardiogram to ensure no new wall motion abnormalities. -Anticipate  discharge once symptoms resolved and blood pressure is better controlled  History of atrial fibrillation with prior ablation -Continue on Eliquis -Continue Coreg and monitor on telemetry  Dyslipidemia -Resume home statin  History of COPD -No acute bronchospasms, continue home medications  History of tobacco abuse -Counseled on cessation   DVT prophylaxis: Eliquis Code Status: Full Family Communication: None at bedside Disposition Plan:Admit for treatment of HTN; rule out CVA/MI Consults called:None Admission status: Obs, Tele  Dustin Fuller D Patryce Depriest DO [facility]  If 7PM-7AM, please contact night-coverage [email or web address]  10/17/2020, 2:15 PM

## 2020-10-17 NOTE — ED Provider Notes (Signed)
[facility] EMERGENCY DEPARTMENT Provider Note   CSN: [id number] Arrival date & time: 10/17/20  1008     History Chief Complaint  Patient presents with   Chest Pain    Dustin Fuller is a 60 y.o. male.   Chest Pain Associated symptoms: numbness and shortness of breath   Associated symptoms: no abdominal pain, no back pain and no fever   Patient presents with chest pain and headache.  States the chest pain has been going for a week.  States it is actually been there longer than a week but for the last week has been more constant.  Is the anterior chest and goes to the arms.  States he gets worse with exertion.  States that before a week ago he was having episodes that would come on with exertion but would not be at rest like now.  Has a previous history of atrial fibrillation post ablation.  Not currently on anticoagulation.  Was inpatient about a month ago for COVID.  States he was in the hospital for 11 days.  States he does feel some shortness of breath.  States he feels that he has some swelling as well as his legs.  States he had been off his cholesterol medicine because they did not give it back to him when he left the hospital. Also has a headache.  States has been on the right side of his head.  States been there for 2 weeks.  States he also feels as if he is having difficulty using his left hand.  States that however is been going on for even longer than the headache.  States at times he will have difficulty speaking but that will also come and go.  Not necessarily associate with a headache but states it could be.  States he has had previous stroke. Previous history of cocaine abuse, however has not used in a few weeks now. Patient with right-sided facial droop.  States his chronic.  States it is both from Bell's palsy and previous stroke he had.    Past Medical History:  Diagnosis Date   Bell's palsy    COPD (chronic obstructive pulmonary disease) (HCC)    Essential  hypertension    Glaucoma    History of atrial fibrillation    Reported ablation - [facility]   History of cocaine abuse (HCC)    Mixed hyperlipidemia    Stroke (HCC) 2019    Patient Active Problem List   Diagnosis Date Noted   Stroke (HCC) 10/17/2020   TIA (transient ischemic attack) 12/18/2018   Right arm weakness    Facial droop 08/31/2017   Tobacco abuse 08/31/2017   COPD (chronic obstructive pulmonary disease) (HCC) 08/31/2017   HTN (hypertension) 08/31/2017    Past Surgical History:  Procedure Laterality Date   CATARACT EXTRACTION     both eyes   glacoma     right eye       Family History  Problem Relation Age of Onset   Cancer Mother     Social History   Tobacco Use   Smoking status: Every Day    Packs/day: 0.25    Types: Cigarettes   Smokeless tobacco: Never  Vaping Use   Vaping Use: Never used  Substance Use Topics   Alcohol use: Not Currently   Drug use: Not Currently    Types: Cocaine    Comment: currently in [facility] as of 10/17/20    Home Medications Prior to Admission medications  Medication Sig Start Date End Date Taking? Authorizing Provider  albuterol (PROVENTIL HFA) 108 (90 Base) MCG/ACT inhaler Inhale 2 puffs into the lungs every 4 (four) hours as needed for wheezing or shortness of breath. 12/18/18  Yes Emokpae, Courage, MD  apixaban (ELIQUIS) 5 MG TABS tablet Take 1 tablet (5 mg total) by mouth 2 (two) times daily. 12/18/18  Yes Emokpae, Courage, MD  carvedilol (COREG) 12.5 MG tablet Take 1 tablet (12.5 mg total) by mouth 2 (two) times daily with a meal. Patient taking differently: Take 25 mg by mouth 2 (two) times daily with a meal. 12/18/18  Yes Emokpae, Courage, MD  furosemide (LASIX) 20 MG tablet Take 20 mg by mouth daily.  08/15/17  Yes [provider]  hydrALAZINE (APRESOLINE) 50 MG tablet Take 1 tablet (50 mg total) by mouth 3 (three) times daily. 12/18/18  Yes Emokpae, Courage, MD  labetalol (NORMODYNE) 200 MG  tablet Take 0.5 tablets (100 mg total) by mouth 2 (two) times daily. 12/18/18  Yes Shon HaleEmokpae, Courage, MD  SYMBICORT 80-4.5 MCG/ACT inhaler Inhale 2 puffs into the lungs 2 (two) times daily. 12/18/18  Yes Emokpae, Courage, MD  ALPHAGAN P 0.1 % SOLN Place 1 drop into both eyes 2 (two) times daily. Patient not taking: Reported on 10/17/2020 12/18/18   Shon HaleEmokpae, Courage, MD  atorvastatin (LIPITOR) 40 MG tablet Take 1 tablet (40 mg total) by mouth every evening. Patient not taking: Reported on 10/17/2020 12/18/18   Shon HaleEmokpae, Courage, MD  dorzolamide-timolol (COSOPT) 22.3-6.8 MG/ML ophthalmic solution Place 1 drop into the right eye 2 (two) times daily. Patient not taking: Reported on 10/17/2020 12/18/18   Shon HaleEmokpae, Courage, MD  isosorbide mononitrate (IMDUR) 30 MG 24 hr tablet Take 1 tablet (30 mg total) by mouth daily. 12/18/18 12/18/19  Shon HaleEmokpae, Courage, MD  latanoprost (XALATAN) 0.005 % ophthalmic solution Place 1 drop into both eyes at bedtime. Patient not taking: Reported on 10/17/2020 12/18/18   Shon HaleEmokpae, Courage, MD  losartan (COZAAR) 50 MG tablet Take 1 tablet (50 mg total) by mouth daily. Patient not taking: Reported on 10/17/2020 12/18/18   Shon HaleEmokpae, Courage, MD  ofloxacin (OCUFLOX) 0.3 % ophthalmic solution Place 1 drop into the right eye 4 (four) times daily. Patient not taking: Reported on 10/17/2020 12/18/18   Shon HaleEmokpae, Courage, MD  prednisoLONE acetate (PRED FORTE) 1 % ophthalmic suspension Place 1 drop into the right eye 2 (two) times daily. Patient not taking: Reported on 10/17/2020 12/18/18   Shon HaleEmokpae, Courage, MD  traZODone (DESYREL) 50 MG tablet Take 1 tablet (50 mg total) by mouth at bedtime. Patient not taking: Reported on 10/17/2020 12/18/18   Shon HaleEmokpae, Courage, MD    Allergies    Diltiazem, Penicillins, Tiotropium bromide monohydrate, and Lisinopril  Review of Systems   Review of Systems  Constitutional:  Negative for appetite change and fever.  HENT:  Negative for congestion.    Respiratory:  Positive for shortness of breath.   Cardiovascular:  Positive for chest pain.  Gastrointestinal:  Negative for abdominal pain.  Genitourinary:  Negative for flank pain.  Musculoskeletal:  Negative for back pain.  Skin:  Negative for rash.  Neurological:  Positive for numbness.  Psychiatric/Behavioral:  Negative for confusion.    Physical Exam Updated Vital Signs BP (!) 160/100   Pulse 68   Temp 97.8 F (36.6 C) (Oral)   Resp 14   Ht 5\' 11"  (1.803 m)   Wt 108.4 kg   SpO2 99%   BMI 33.33 kg/m   Physical Exam Vitals  and nursing note reviewed.  HENT:     Head:     Comments: Right-sided facial droop. Eyes:     Pupils: Pupils are equal, round, and reactive to light.  Cardiovascular:     Rate and Rhythm: Normal rate and regular rhythm.  Pulmonary:     Effort: No tachypnea.     Breath sounds: No decreased breath sounds or wheezing.  Chest:     Chest wall: No tenderness.  Abdominal:     Tenderness: There is no abdominal tenderness.  Musculoskeletal:     Cervical back: Neck supple.     Right lower leg: No edema.     Left lower leg: No edema.  Skin:    General: Skin is warm.  Neurological:     Mental Status: He is alert.     Comments: Right-sided facial droop.  Eye movements intact.  Per patient paresthesias to left hand.  Good grip strength bilaterally.  Good straight leg raise bilaterally.    ED Results / Procedures / Treatments   Labs (all labs ordered are listed, but only abnormal results are displayed) Labs Reviewed  BASIC METABOLIC PANEL - Abnormal; Notable for the following components:      Result Value   Calcium 8.7 (*)    All other components within normal limits  RESP PANEL BY RT-PCR (FLU A&B, COVID) ARPGX2  RAPID URINE DRUG SCREEN, HOSP PERFORMED  CBC  D-DIMER, QUANTITATIVE  TROPONIN I (HIGH SENSITIVITY)  TROPONIN I (HIGH SENSITIVITY)    EKG EKG Interpretation  Date/Time:  Friday October 17 2020 10:23:40 EDT Ventricular Rate:  61 PR  Interval:  198 QRS Duration: 83 QT Interval:  424 QTC Calculation: 428 R Axis:   13 Text Interpretation: Sinus rhythm Nonspecific T abnormalities, lateral leads Borderline ST elevation, anterior leads No significant change since last tracing Confirmed by Benjiman Core [phone]) on 10/17/2020 10:27:21 AM  Radiology CT HEAD WO CONTRAST ( )  Result Date: 10/17/2020 CLINICAL DATA:  Headache EXAM: CT HEAD WITHOUT CONTRAST TECHNIQUE: Contiguous axial images were obtained from the base of the skull through the vertex without intravenous contrast. COMPARISON:  Brain MRI 12/18/2018, CT head 12/18/2018 FINDINGS: Brain: There is no evidence of acute intracranial hemorrhage, extra-axial fluid collection, or infarct. The parenchyma is normal in appearance. The ventricles are nonenlarged. There is no mass lesion. There is no midline shift. Vascular: There is calcification of the bilateral cavernous ICAs. Skull: Normal. Negative for fracture or focal lesion. Sinuses/Orbits: The imaged paranasal sinuses and mastoid air cells are clear. Bilateral lens implants are in place. The globes and orbits are otherwise unremarkable. Other: None. IMPRESSION: No acute intracranial pathology. Electronically Signed   By: Lesia Hausen M.D.   On: 10/17/2020 12:07   DG Chest Portable 1 View  Result Date: 10/17/2020 CLINICAL DATA:  60 year old male with chest pain. EXAM: PORTABLE CHEST - 1 VIEW COMPARISON:  None available. FINDINGS: The mediastinal contours are within normal limits. No cardiomegaly. The lungs are clear bilaterally without evidence of focal consolidation, pleural effusion, or pneumothorax. No acute osseous abnormality. IMPRESSION: No acute cardiopulmonary process. Electronically Signed   By: Marliss Coots M.D.   On: 10/17/2020 11:44    Procedures Procedures   Medications Ordered in ED Medications - No data to display  ED Course  I have reviewed the triage vital signs and the nursing notes.  Pertinent labs &  imaging results that were available during my care of the patient were reviewed by me and considered in my medical  decision making (see chart for details).    MDM Rules/Calculators/A&P                           Patient with chest pain.  Left chest.  Has been there for around a week but states that he has been having episodes before that but some exertional pain.  States the pain gets worse with exertion now.  Does feel more short of breath.  Troponin negative.  EKG reassuring.  D-dimer negative.  Did have recent admission to the hospital for COVID around a month ago.  With the chest pain and high risk with high blood pressure high cholesterol previous cocaine use I feels patient benefit from mission to the hospital for further work-up of chest pain. Also states he has been having difficulty moving his left hand.  States has been going for a while however.  Reviewing records it looks like he has had some episodes of this in the past.  States now it feels numb.  Head CT done reassuring.  Previous history of A. fib.  Appears to be on anticoagulation. Will discuss with hospitalist for admission. Final Clinical Impression(s) / ED Diagnoses Final diagnoses:  Chest pain, unspecified type  Numbness    Rx / DC Orders ED Discharge Orders     None        Benjiman Core, MD 10/17/20 1303

## 2020-10-17 NOTE — ED Triage Notes (Signed)
Patient is being discharged from the Urgent Care and sent to the Emergency Department via ems . Per B Wurst , patient is in need of higher level of care due to chest pain . Patient is aware and verbalizes understanding of plan of care.  Vitals:   10/17/20 0933  BP: (!) 167/96  Pulse: 68  Resp: 20  Temp: 98.1 F (36.7 C)  SpO2: 97%

## 2020-10-17 NOTE — ED Triage Notes (Signed)
Pt presents with chest pain 8/10 and headache. Pt states has hurt for week and also having left arm numbness

## 2020-10-17 NOTE — ED Notes (Signed)
EMS here for transport to Emergency.

## 2020-10-18 ENCOUNTER — Encounter (HOSPITAL_COMMUNITY): Payer: Self-pay | Admitting: Internal Medicine

## 2020-10-18 ENCOUNTER — Observation Stay (HOSPITAL_BASED_OUTPATIENT_CLINIC_OR_DEPARTMENT_OTHER): Payer: Medicaid Other

## 2020-10-18 DIAGNOSIS — I169 Hypertensive crisis, unspecified: Secondary | ICD-10-CM | POA: Diagnosis not present

## 2020-10-18 DIAGNOSIS — R079 Chest pain, unspecified: Secondary | ICD-10-CM | POA: Diagnosis not present

## 2020-10-18 LAB — BASIC METABOLIC PANEL
Anion gap: 8 (ref 5–15)
BUN: 14 mg/dL (ref 6–20)
CO2: 26 mmol/L (ref 22–32)
Calcium: 9.2 mg/dL (ref 8.9–10.3)
Chloride: 105 mmol/L (ref 98–111)
Creatinine, Ser: 0.85 mg/dL (ref 0.61–1.24)
GFR, Estimated: >60 mL/min (ref >60)
Glucose, Bld: 96 mg/dL (ref 70–99)
Potassium: 3.8 mmol/L (ref 3.5–5.1)
Sodium: 139 mmol/L (ref 135–145)

## 2020-10-18 LAB — CBC
HCT: 43.2 % (ref 39.0–52.0)
Hemoglobin: 14.3 g/dL (ref 13.0–17.0)
MCH: 31 pg (ref 26.0–34.0)
MCHC: 33.1 g/dL (ref 30.0–36.0)
MCV: 93.5 fL (ref 80.0–100.0)
Platelets: 179 10*3/uL (ref 150–400)
RBC: 4.62 MIL/uL (ref 4.22–5.81)
RDW: 13.3 % (ref 11.5–15.5)
WBC: 5.6 10*3/uL (ref 4.0–10.5)
nRBC: 0 % (ref 0.0–0.2)

## 2020-10-18 LAB — ECHOCARDIOGRAM COMPLETE
Area-P 1/2: 2.8 cm2
Height: 71 in
S' Lateral: 3.38 cm
Weight: 3824 oz

## 2020-10-18 LAB — MAGNESIUM: Magnesium: 2.1 mg/dL (ref 1.7–2.4)

## 2020-10-18 MED ORDER — ISOSORBIDE MONONITRATE ER 30 MG PO TB24: 30.0000 mg | ORAL_TABLET | Freq: Every day | ORAL | 11 refills | Status: AC

## 2020-10-18 MED ORDER — HYDRALAZINE HCL 50 MG PO TABS: 50.0000 mg | ORAL_TABLET | Freq: Two times a day (BID) | ORAL | 5 refills | Status: AC

## 2020-10-18 MED ORDER — ATORVASTATIN CALCIUM 40 MG PO TABS: 40.0000 mg | ORAL_TABLET | Freq: Every evening | ORAL | 5 refills | Status: AC

## 2020-10-18 MED ORDER — CARVEDILOL 25 MG PO TABS: 25.0000 mg | ORAL_TABLET | Freq: Two times a day (BID) | ORAL | 5 refills | Status: AC

## 2020-10-18 NOTE — Progress Notes (Signed)
  Echocardiogram 2D Echocardiogram has been performed.  Augustine Radar 10/18/2020, 3:00 PM

## 2020-10-18 NOTE — Progress Notes (Signed)
Patient discharged home today, transported by family/friend. Discharge paperwork went over with patient, patient verbalized understanding. Belongings sent home with patient. Patient stable upon discharge.

## 2020-10-18 NOTE — Discharge Summary (Signed)
Physician Discharge Summary  Fransisca ConnorsMichael Stickles JYN:[id number] DOB: 06/02/1960 DOA: 10/17/2020  PCP: Pcp, No  Admit date: 10/17/2020 Discharge date: 10/18/2020  Admitted From: Home  Disposition:  Home   Recommendations for Outpatient Follow-up:  Follow up with PCP [facility]: Please confirm medications below and titrate BP meds as needed       Home Health: None   Equipment/Devices: None new  Discharge Condition: Good  CODE STATUS: FULL Diet recommendation: Cardiac  Brief/Interim Summary: Mr. Dustin Fuller is a 60 y.o. M with HTN, prior stroke with residual right weakness, paroxysmal AF on Eliquis, smoking who presented with 1-2 weeks intermittent chest pain, both exertional and not as well as left elbow pain and numbness and high blood pressure.  In the ER, his BP was 190s systolic and troponin and ECG normal.  Admitted for hypertensive urgency.     PRINCIPAL HOSPITAL DIAGNOSIS: Hypertensive urgency    Discharge Diagnoses:   Hypertensive urgency Patient was admitted and started on his home blood pressure medicines.  There appears to be some confusion about what medicines he is taking, and he was only taking furosemide, Coreg, and duplicate beta-blocker with labetalol.  He was not taking Imdur, hydralazine, or losartan.  Discharged with the following regimen: Carvedilol 25 twice daily Furosemide 40 daily Imdur 30 daily Hydralazine 25 twice daily  BP here normal in the 120s today.  Follow up with PCP for titration.   Chest pain ECGs and troponins negative.  Symptoms resolved with controlled blood pressure.  Able to ambulate today without dyspnea on exertion or chest discomfort.  Left elbow pain and numbness Doubt TIA.  Symptoms persisting today unchanged, but MRI brain negative for stroke.  Given the pain, I suspect this is more a neuropathy, may be an ulnar neuropathy.  Recommend PCP follow-up.  History stroke Resume Crestor for secondary  stroke rpevention  Paroxysmal atrial fibrillation Continue Eliquis         Discharge Instructions  Discharge Instructions     Diet - low sodium heart healthy   Complete by: As directed    Increase activity slowly   Complete by: As directed       Allergies as of 10/18/2020       Reactions   Diltiazem Other (See Comments)   IV   Penicillins Other (See Comments)   Tiotropium Bromide Monohydrate Other (See Comments)   Lisinopril         Medication List     STOP taking these medications    Alphagan P 0.1 % Soln Generic drug: brimonidine   dorzolamide-timolol 22.3-6.8 MG/ML ophthalmic solution Commonly known as: COSOPT   labetalol 200 MG tablet Commonly known as: NORMODYNE   latanoprost 0.005 % ophthalmic solution Commonly known as: XALATAN   losartan 50 MG tablet Commonly known as: COZAAR   ofloxacin 0.3 % ophthalmic solution Commonly known as: OCUFLOX   prednisoLONE acetate 1 % ophthalmic suspension Commonly known as: PRED FORTE   traZODone 50 MG tablet Commonly known as: DESYREL       TAKE these medications    albuterol 108 (90 Base) MCG/ACT inhaler Commonly known as: Proventil HFA Inhale 2 puffs into the lungs every 4 (four) hours as needed for wheezing or shortness of breath.   apixaban 5 MG Tabs tablet Commonly known as: Eliquis Take 1 tablet (5 mg total) by mouth 2 (two) times daily.   atorvastatin 40 MG tablet Commonly known as: LIPITOR Take 1 tablet (40 mg total) by mouth  every evening.   carvedilol 25 MG tablet Commonly known as: COREG Take 1 tablet (25 mg total) by mouth 2 (two) times daily with a meal. What changed:  medication strength how much to take   furosemide 20 MG tablet Commonly known as: LASIX Take 20 mg by mouth daily.   hydrALAZINE 50 MG tablet Commonly known as: APRESOLINE Take 1 tablet (50 mg total) by mouth in the morning and at bedtime. What changed: when to take this   isosorbide mononitrate 30 MG  24 hr tablet Commonly known as: IMDUR Take 1 tablet (30 mg total) by mouth daily.   Symbicort 80-4.5 MCG/ACT inhaler Generic drug: budesonide-formoterol Inhale 2 puffs into the lungs 2 (two) times daily.        Allergies  Allergen Reactions   Diltiazem Other (See Comments)    IV   Penicillins Other (See Comments)   Tiotropium Bromide Monohydrate Other (See Comments)   Lisinopril        Procedures/Studies: CT HEAD WO CONTRAST ( )  Result Date: 10/17/2020 CLINICAL DATA:  Headache EXAM: CT HEAD WITHOUT CONTRAST TECHNIQUE: Contiguous axial images were obtained from the base of the skull through the vertex without intravenous contrast. COMPARISON:  Brain MRI 12/18/2018, CT head 12/18/2018 FINDINGS: Brain: There is no evidence of acute intracranial hemorrhage, extra-axial fluid collection, or infarct. The parenchyma is normal in appearance. The ventricles are nonenlarged. There is no mass lesion. There is no midline shift. Vascular: There is calcification of the bilateral cavernous ICAs. Skull: Normal. Negative for fracture or focal lesion. Sinuses/Orbits: The imaged paranasal sinuses and mastoid air cells are clear. Bilateral lens implants are in place. The globes and orbits are otherwise unremarkable. Other: None. IMPRESSION: No acute intracranial pathology. Electronically Signed   By: Lesia Hausen M.D.   On: 10/17/2020 12:07   MR BRAIN WO CONTRAST  Result Date: 10/17/2020 CLINICAL DATA:  Neuro deficit, acute, stroke suspected. Headache and difficulty using the left hand for 2 weeks. EXAM: MRI HEAD WITHOUT CONTRAST TECHNIQUE: Multiplanar, multiecho pulse sequences of the brain and surrounding structures were obtained without intravenous contrast. COMPARISON:  Head CT 10/17/2020 and MRI 12/18/2018 FINDINGS: Brain: There is no evidence of an acute infarct, intracranial hemorrhage, mass, midline shift, or extra-axial fluid collection. Small T2 hyperintensities in the cerebral white matter  bilaterally are similar to the prior MRI and are nonspecific but compatible with mild chronic small vessel ischemic disease. The ventricles and sulci are within normal limits for age. Vascular: Major intracranial vascular flow voids are preserved. Skull and upper cervical spine: Unchanged abnormality of C2 suggestive of a chronic dens fracture. Sinuses/Orbits: Bilateral cataract extraction. Small mucous retention cyst in the left maxillary sinus. Clear mastoid air cells. Other: None. IMPRESSION: 1. No acute intracranial abnormality. 2. Mild chronic small vessel ischemic disease. Electronically Signed   By: Sebastian Ache M.D.   On: 10/17/2020 14:40   DG Chest Portable 1 View  Result Date: 10/17/2020 CLINICAL DATA:  60 year old male with chest pain. EXAM: PORTABLE CHEST - 1 VIEW COMPARISON:  None available. FINDINGS: The mediastinal contours are within normal limits. No cardiomegaly. The lungs are clear bilaterally without evidence of focal consolidation, pleural effusion, or pneumothorax. No acute osseous abnormality. IMPRESSION: No acute cardiopulmonary process. Electronically Signed   By: Marliss Coots M.D.   On: 10/17/2020 11:44      Subjective: Feeling well.  Ambulated without chest pain, dyspnea.  Still has left elbow discomfort and numbness, but no more headache or chest symptoms.  Discharge Exam: Vitals:   10/18/20 0836 10/18/20 1127  BP: (!) 178/94 129/88  Pulse: 63 64  Resp:  16  Temp:  97.9 F (36.6 C)  SpO2:  100%   Vitals:   10/18/20 0714 10/18/20 0756 10/18/20 0836 10/18/20 1127  BP: (!) 159/95  (!) 178/94 129/88  Pulse: 64  63 64  Resp: 18   16  Temp: 97.7 F (36.5 C)   97.9 F (36.6 C)  TempSrc: Oral   Oral  SpO2: 98% 99%  100%  Weight:      Height:        General: Pt is alert, awake, not in acute distress Cardiovascular: RRR, nl S1-S2, no murmurs appreciated.   No LE edema.   Respiratory: Normal respiratory rate and rhythm.  CTAB without rales or  wheezes. Abdominal: Abdomen soft and non-tender.  No distension or HSM.   Neuro/Psych: Strength symmetric in upper and lower extremities.  Judgment and insight appear normal.   The results of significant diagnostics from this hospitalization (including imaging, microbiology, ancillary and laboratory) are listed below for reference.     Microbiology: Recent Results (from the past 240 hour(s))  Resp Panel by RT-PCR (Flu A&B, Covid) Nasopharyngeal Swab     Status: None   Collection Time: 10/17/20 11:13 AM   Specimen: Nasopharyngeal Swab; Nasopharyngeal(NP) swabs in vial transport medium  Result Value Ref Range Status   SARS Coronavirus 2 by RT PCR NEGATIVE NEGATIVE Final    Comment: (NOTE) SARS-CoV-2 target nucleic acids are NOT DETECTED.  The SARS-CoV-2 RNA is generally detectable in upper respiratory specimens during the acute phase of infection. The lowest concentration of SARS-CoV-2 viral copies this assay can detect is 138 copies/mL. A negative result does not preclude SARS-Cov-2 infection and should not be used as the sole basis for treatment or other patient management decisions. A negative result may occur with  improper specimen collection/handling, submission of specimen other than nasopharyngeal swab, presence of viral mutation(s) within the areas targeted by this assay, and inadequate number of viral copies(<138 copies/mL). A negative result must be combined with clinical observations, patient history, and epidemiological information. The expected result is Negative.  Fact Sheet for Patients:  BloggerCourse.com  Fact Sheet for Healthcare Providers:  SeriousBroker.it  This test is no t yet approved or cleared by the [place] FDA and  has been authorized for detection and/or diagnosis of SARS-CoV-2 by FDA under an Emergency Use Authorization (EUA). This EUA will remain  in effect (meaning this test can be used) for  the duration of the COVID-19 declaration under Section 564(b)(1) of the Act, 21 U.S.C.section 360bbb-3(b)(1), unless the authorization is terminated  or revoked sooner.       Influenza A by PCR NEGATIVE NEGATIVE Final   Influenza B by PCR NEGATIVE NEGATIVE Final    Comment: (NOTE) The Xpert Xpress SARS-CoV-2/FLU/RSV plus assay is intended as an aid in the diagnosis of influenza from Nasopharyngeal swab specimens and should not be used as a sole basis for treatment. Nasal washings and aspirates are unacceptable for Xpert Xpress SARS-CoV-2/FLU/RSV testing.  Fact Sheet for Patients: BloggerCourse.com  Fact Sheet for Healthcare Providers: SeriousBroker.it  This test is not yet approved or cleared by the [place] FDA and has been authorized for detection and/or diagnosis of SARS-CoV-2 by FDA under an Emergency Use Authorization (EUA). This EUA will remain in effect (meaning this test can be used) for the duration of the COVID-19 declaration under Section 564(b)(1) of the Act, 21  U.S.C. section 360bbb-3(b)(1), unless the authorization is terminated or revoked.  Performed at [facility], [address], [place], [place] [address]      Labs: BNP (last 3 results) No results for input(s): BNP in the last 8760 hours. Basic Metabolic Panel: Recent Labs  Lab 10/17/20 1112 10/18/20 0431  NA 137 139  K 3.9 3.8  CL 106 105  CO2 25 26  GLUCOSE 88 96  BUN 14 14  CREATININE 0.76 0.85  CALCIUM 8.7* 9.2  MG  --  2.1   Liver Function Tests: No results for input(s): AST, ALT, ALKPHOS, BILITOT, PROT, ALBUMIN in the last 168 hours. No results for input(s): LIPASE, AMYLASE in the last 168 hours. No results for input(s): AMMONIA in the last 168 hours. CBC: Recent Labs  Lab 10/17/20 1112 10/18/20 0431  WBC 5.2 5.6  HGB 14.8 14.3  HCT 44.9 43.2  MCV 94.3 93.5  PLT 200 179   Cardiac Enzymes: No results for input(s): CKTOTAL,  CKMB, CKMBINDEX, TROPONINI in the last 168 hours. BNP: Invalid input(s): POCBNP CBG: No results for input(s): GLUCAP in the last 168 hours. D-Dimer Recent Labs    10/17/20 1112  DDIMER <0.27   Hgb A1c No results for input(s): HGBA1C in the last 72 hours. Lipid Profile No results for input(s): CHOL, HDL, LDLCALC, TRIG, CHOLHDL, LDLDIRECT in the last 72 hours. Thyroid function studies No results for input(s): TSH, T4TOTAL, T3FREE, THYROIDAB in the last 72 hours.  Invalid input(s): FREET3 Anemia work up No results for input(s): VITAMINB12, FOLATE, FERRITIN, TIBC, IRON, RETICCTPCT in the last 72 hours. Urinalysis    Component Value Date/Time   COLORURINE STRAW (A) 12/18/2018 0853   APPEARANCEUR CLEAR 12/18/2018 0853   LABSPEC 1.005 12/18/2018 0853   PHURINE 5.0 12/18/2018 0853   GLUCOSEU NEGATIVE 12/18/2018 0853   HGBUR NEGATIVE 12/18/2018 0853   BILIRUBINUR NEGATIVE 12/18/2018 0853   KETONESUR NEGATIVE 12/18/2018 0853   PROTEINUR NEGATIVE 12/18/2018 0853   NITRITE NEGATIVE 12/18/2018 0853   LEUKOCYTESUR NEGATIVE 12/18/2018 0853   Sepsis Labs Invalid input(s): PROCALCITONIN,  WBC,  LACTICIDVEN Microbiology Recent Results (from the past 240 hour(s))  Resp Panel by RT-PCR (Flu A&B, Covid) Nasopharyngeal Swab     Status: None   Collection Time: 10/17/20 11:13 AM   Specimen: Nasopharyngeal Swab; Nasopharyngeal(NP) swabs in vial transport medium  Result Value Ref Range Status   SARS Coronavirus 2 by RT PCR NEGATIVE NEGATIVE Final    Comment: (NOTE) SARS-CoV-2 target nucleic acids are NOT DETECTED.  The SARS-CoV-2 RNA is generally detectable in upper respiratory specimens during the acute phase of infection. The lowest concentration of SARS-CoV-2 viral copies this assay can detect is 138 copies/mL. A negative result does not preclude SARS-Cov-2 infection and should not be used as the sole basis for treatment or other patient management decisions. A negative result may occur  with  improper specimen collection/handling, submission of specimen other than nasopharyngeal swab, presence of viral mutation(s) within the areas targeted by this assay, and inadequate number of viral copies(<138 copies/mL). A negative result must be combined with clinical observations, patient history, and epidemiological information. The expected result is Negative.  Fact Sheet for Patients:  BloggerCourse.com  Fact Sheet for Healthcare Providers:  SeriousBroker.it  This test is no t yet approved or cleared by the [place] FDA and  has been authorized for detection and/or diagnosis of SARS-CoV-2 by FDA under an Emergency Use Authorization (EUA). This EUA will remain  in effect (meaning this test can  be used) for the duration of the COVID-19 declaration under Section 564(b)(1) of the Act, 21 U.S.C.section 360bbb-3(b)(1), unless the authorization is terminated  or revoked sooner.       Influenza A by PCR NEGATIVE NEGATIVE Final   Influenza B by PCR NEGATIVE NEGATIVE Final    Comment: (NOTE) The Xpert Xpress SARS-CoV-2/FLU/RSV plus assay is intended as an aid in the diagnosis of influenza from Nasopharyngeal swab specimens and should not be used as a sole basis for treatment. Nasal washings and aspirates are unacceptable for Xpert Xpress SARS-CoV-2/FLU/RSV testing.  Fact Sheet for Patients: BloggerCourse.com  Fact Sheet for Healthcare Providers: SeriousBroker.it  This test is not yet approved or cleared by the [place] FDA and has been authorized for detection and/or diagnosis of SARS-CoV-2 by FDA under an Emergency Use Authorization (EUA). This EUA will remain in effect (meaning this test can be used) for the duration of the COVID-19 declaration under Section 564(b)(1) of the Act, 21 U.S.C. section 360bbb-3(b)(1), unless the authorization is terminated  or revoked.  Performed at [facility], [address], [place], [place] [address]      Time coordinating discharge: 25 minutes .         SIGNED:   Alberteen Sam, MD  [facility] 10/18/2020, 12:01 PM

## 2020-10-18 NOTE — Progress Notes (Signed)
Pt ambulated entire length of hallway unassisted without SOB or chest pain. VS obtained post-ambulation, WNL. MD Danford updated with VS.

## 2020-11-24 ENCOUNTER — Emergency Department (HOSPITAL_COMMUNITY): Payer: Medicaid Other

## 2020-11-24 ENCOUNTER — Other Ambulatory Visit: Payer: Self-pay

## 2020-11-24 ENCOUNTER — Inpatient Hospital Stay (HOSPITAL_COMMUNITY)
Admission: EM | Admit: 2020-11-24 | Discharge: 2020-11-27 | DRG: 190 | Disposition: A | Discharge status: Home / Self Care | Payer: Medicaid Other | Attending: Internal Medicine | Admitting: Internal Medicine

## 2020-11-24 ENCOUNTER — Encounter (HOSPITAL_COMMUNITY): Payer: Self-pay

## 2020-11-24 DIAGNOSIS — Z7901 Long term (current) use of anticoagulants: Secondary | ICD-10-CM

## 2020-11-24 DIAGNOSIS — I639 Cerebral infarction, unspecified: Secondary | ICD-10-CM | POA: Diagnosis present

## 2020-11-24 DIAGNOSIS — Z720 Tobacco use: Secondary | ICD-10-CM | POA: Diagnosis present

## 2020-11-24 DIAGNOSIS — Z20822 Contact with and (suspected) exposure to covid-19: Secondary | ICD-10-CM | POA: Diagnosis present

## 2020-11-24 DIAGNOSIS — G510 Bell's palsy: Secondary | ICD-10-CM | POA: Diagnosis present

## 2020-11-24 DIAGNOSIS — J9601 Acute respiratory failure with hypoxia: Secondary | ICD-10-CM

## 2020-11-24 DIAGNOSIS — R29898 Other symptoms and signs involving the musculoskeletal system: Secondary | ICD-10-CM | POA: Diagnosis present

## 2020-11-24 DIAGNOSIS — J441 Chronic obstructive pulmonary disease with (acute) exacerbation: Principal | ICD-10-CM | POA: Diagnosis present

## 2020-11-24 DIAGNOSIS — Z8679 Personal history of other diseases of the circulatory system: Secondary | ICD-10-CM | POA: Diagnosis not present

## 2020-11-24 DIAGNOSIS — I480 Paroxysmal atrial fibrillation: Secondary | ICD-10-CM

## 2020-11-24 DIAGNOSIS — J449 Chronic obstructive pulmonary disease, unspecified: Secondary | ICD-10-CM

## 2020-11-24 DIAGNOSIS — Z9114 Patient's other noncompliance with medication regimen: Secondary | ICD-10-CM

## 2020-11-24 DIAGNOSIS — Z8673 Personal history of transient ischemic attack (TIA), and cerebral infarction without residual deficits: Secondary | ICD-10-CM

## 2020-11-24 DIAGNOSIS — Z888 Allergy status to other drugs, medicaments and biological substances status: Secondary | ICD-10-CM

## 2020-11-24 DIAGNOSIS — F1411 Cocaine abuse, in remission: Secondary | ICD-10-CM | POA: Diagnosis present

## 2020-11-24 DIAGNOSIS — F17210 Nicotine dependence, cigarettes, uncomplicated: Secondary | ICD-10-CM | POA: Diagnosis present

## 2020-11-24 DIAGNOSIS — J9621 Acute and chronic respiratory failure with hypoxia: Secondary | ICD-10-CM | POA: Diagnosis present

## 2020-11-24 DIAGNOSIS — H409 Unspecified glaucoma: Secondary | ICD-10-CM | POA: Diagnosis present

## 2020-11-24 DIAGNOSIS — E782 Mixed hyperlipidemia: Secondary | ICD-10-CM | POA: Diagnosis present

## 2020-11-24 DIAGNOSIS — Z880 Allergy status to penicillin: Secondary | ICD-10-CM

## 2020-11-24 DIAGNOSIS — F1410 Cocaine abuse, uncomplicated: Secondary | ICD-10-CM | POA: Diagnosis present

## 2020-11-24 DIAGNOSIS — I10 Essential (primary) hypertension: Secondary | ICD-10-CM | POA: Diagnosis present

## 2020-11-24 LAB — BASIC METABOLIC PANEL
Anion gap: 7 (ref 5–15)
BUN: 13 mg/dL (ref 6–20)
CO2: 29 mmol/L (ref 22–32)
Calcium: 9.2 mg/dL (ref 8.9–10.3)
Chloride: 101 mmol/L (ref 98–111)
Creatinine, Ser: 0.82 mg/dL (ref 0.61–1.24)
GFR, Estimated: >60 mL/min (ref >60)
Glucose, Bld: 108 mg/dL — ABNORMAL HIGH (ref 70–99)
Potassium: 4.3 mmol/L (ref 3.5–5.1)
Sodium: 137 mmol/L (ref 135–145)

## 2020-11-24 LAB — RESP PANEL BY RT-PCR (FLU A&B, COVID) ARPGX2
Influenza A by PCR: NEGATIVE
Influenza B by PCR: NEGATIVE
SARS Coronavirus 2 by RT PCR: NEGATIVE

## 2020-11-24 LAB — CBC
HCT: 48 % (ref 39.0–52.0)
Hemoglobin: 16.2 g/dL (ref 13.0–17.0)
MCH: 32.1 pg (ref 26.0–34.0)
MCHC: 33.8 g/dL (ref 30.0–36.0)
MCV: 95 fL (ref 80.0–100.0)
Platelets: 193 10*3/uL (ref 150–400)
RBC: 5.05 MIL/uL (ref 4.22–5.81)
RDW: 13.2 % (ref 11.5–15.5)
WBC: 9.1 10*3/uL (ref 4.0–10.5)
nRBC: 0 % (ref 0.0–0.2)

## 2020-11-24 LAB — BRAIN NATRIURETIC PEPTIDE: B Natriuretic Peptide: 225 pg/mL — ABNORMAL HIGH (ref 0.0–100.0)

## 2020-11-24 LAB — TROPONIN I (HIGH SENSITIVITY)
Troponin I (High Sensitivity): 4 ng/L (ref <18)
Troponin I (High Sensitivity): 3 ng/L (ref <18)

## 2020-11-24 MED ORDER — NICOTINE 21 MG/24HR TD PT24
21.0000 mg | MEDICATED_PATCH | Freq: Every day | TRANSDERMAL | Status: DC
  Administered 2020-11-24 – 2020-11-27 (×4): 21 mg via TRANSDERMAL
  Filled 2020-11-24 (×4): qty 1

## 2020-11-24 MED ORDER — ACETAMINOPHEN 325 MG PO TABS: 650.0000 mg | ORAL_TABLET | Freq: Four times a day (QID) | ORAL | Status: DC | PRN

## 2020-11-24 MED ORDER — METHYLPREDNISOLONE SODIUM SUCC 125 MG IJ SOLR
125.0000 mg | Freq: Once | INTRAMUSCULAR | Status: AC
  Administered 2020-11-24: 125 mg via INTRAVENOUS
  Filled 2020-11-24: qty 2

## 2020-11-24 MED ORDER — MAGNESIUM SULFATE IN D5W 1-5 GM/100ML-% IV SOLN
1.0000 g | Freq: Once | INTRAVENOUS | Status: AC
  Administered 2020-11-24: 1 g via INTRAVENOUS
  Filled 2020-11-24: qty 100

## 2020-11-24 MED ORDER — NITROGLYCERIN 2 % TD OINT
1.0000 [in_us] | TOPICAL_OINTMENT | Freq: Four times a day (QID) | TRANSDERMAL | Status: DC
  Administered 2020-11-24: 1 [in_us] via TOPICAL
  Filled 2020-11-24: qty 1

## 2020-11-24 MED ORDER — FUROSEMIDE 20 MG PO TABS
20.0000 mg | ORAL_TABLET | Freq: Every day | ORAL | Status: DC
  Administered 2020-11-25 – 2020-11-27 (×3): 20 mg via ORAL
  Filled 2020-11-24 (×3): qty 1

## 2020-11-24 MED ORDER — ALBUTEROL (5 MG/ML) CONTINUOUS INHALATION SOLN
10.0000 mg/h | INHALATION_SOLUTION | Freq: Once | RESPIRATORY_TRACT | Status: AC
  Administered 2020-11-24: 10 mg/h via RESPIRATORY_TRACT
  Filled 2020-11-24: qty 20

## 2020-11-24 MED ORDER — NITROGLYCERIN 2 % TD OINT: 1.0000 [in_us] | TOPICAL_OINTMENT | Freq: Four times a day (QID) | TRANSDERMAL | Status: DC

## 2020-11-24 MED ORDER — DOXYCYCLINE HYCLATE 100 MG PO TABS
100.0000 mg | ORAL_TABLET | Freq: Two times a day (BID) | ORAL | Status: DC
  Administered 2020-11-24 – 2020-11-27 (×7): 100 mg via ORAL
  Filled 2020-11-24 (×7): qty 1

## 2020-11-24 MED ORDER — CARVEDILOL 12.5 MG PO TABS
25.0000 mg | ORAL_TABLET | Freq: Two times a day (BID) | ORAL | Status: DC
  Administered 2020-11-24 – 2020-11-27 (×7): 25 mg via ORAL
  Filled 2020-11-24 (×7): qty 2

## 2020-11-24 MED ORDER — HYDRALAZINE HCL 25 MG PO TABS
50.0000 mg | ORAL_TABLET | Freq: Two times a day (BID) | ORAL | Status: DC
  Administered 2020-11-24 – 2020-11-27 (×6): 50 mg via ORAL
  Filled 2020-11-24 (×7): qty 2

## 2020-11-24 MED ORDER — MOMETASONE FURO-FORMOTEROL FUM 100-5 MCG/ACT IN AERO
2.0000 | INHALATION_SPRAY | Freq: Two times a day (BID) | RESPIRATORY_TRACT | Status: DC
  Administered 2020-11-24 – 2020-11-26 (×4): 2 via RESPIRATORY_TRACT
  Filled 2020-11-24: qty 8.8

## 2020-11-24 MED ORDER — IPRATROPIUM-ALBUTEROL 0.5-2.5 (3) MG/3ML IN SOLN
3.0000 mL | Freq: Once | RESPIRATORY_TRACT | Status: AC
  Administered 2020-11-24: 3 mL via RESPIRATORY_TRACT
  Filled 2020-11-24: qty 3

## 2020-11-24 MED ORDER — HYDRALAZINE HCL 20 MG/ML IJ SOLN
20.0000 mg | Freq: Once | INTRAMUSCULAR | Status: AC
  Administered 2020-11-24: 20 mg via INTRAVENOUS
  Filled 2020-11-24: qty 1

## 2020-11-24 MED ORDER — ATORVASTATIN CALCIUM 40 MG PO TABS
40.0000 mg | ORAL_TABLET | Freq: Every evening | ORAL | Status: DC
  Administered 2020-11-24 – 2020-11-26 (×3): 40 mg via ORAL
  Filled 2020-11-24 (×3): qty 1

## 2020-11-24 MED ORDER — ACETAMINOPHEN 650 MG RE SUPP: 650.0000 mg | Freq: Four times a day (QID) | RECTAL | Status: DC | PRN

## 2020-11-24 MED ORDER — BISACODYL 5 MG PO TBEC: 5.0000 mg | DELAYED_RELEASE_TABLET | Freq: Every day | ORAL | Status: DC | PRN

## 2020-11-24 MED ORDER — IPRATROPIUM-ALBUTEROL 0.5-2.5 (3) MG/3ML IN SOLN
3.0000 mL | Freq: Four times a day (QID) | RESPIRATORY_TRACT | Status: DC
Start: 2020-11-24 — End: 2020-11-27
  Administered 2020-11-24 – 2020-11-27 (×13): 3 mL via RESPIRATORY_TRACT
  Filled 2020-11-24 (×13): qty 3

## 2020-11-24 MED ORDER — PREDNISONE 20 MG PO TABS
40.0000 mg | ORAL_TABLET | Freq: Every day | ORAL | Status: DC
Start: 2020-11-26 — End: 2020-11-25

## 2020-11-24 MED ORDER — APIXABAN 5 MG PO TABS
5.0000 mg | ORAL_TABLET | Freq: Two times a day (BID) | ORAL | Status: DC
  Administered 2020-11-24 – 2020-11-27 (×6): 5 mg via ORAL
  Filled 2020-11-24 (×6): qty 1

## 2020-11-24 MED ORDER — ONDANSETRON HCL 4 MG PO TABS: 4.0000 mg | ORAL_TABLET | Freq: Four times a day (QID) | ORAL | Status: DC | PRN

## 2020-11-24 MED ORDER — GUAIFENESIN ER 600 MG PO TB12
1200.0000 mg | ORAL_TABLET | Freq: Two times a day (BID) | ORAL | Status: DC
  Administered 2020-11-24 – 2020-11-27 (×7): 1200 mg via ORAL
  Filled 2020-11-24 (×7): qty 2

## 2020-11-24 MED ORDER — ZOLPIDEM TARTRATE 5 MG PO TABS
5.0000 mg | ORAL_TABLET | Freq: Every evening | ORAL | Status: DC | PRN
  Filled 2020-11-24: qty 1

## 2020-11-24 MED ORDER — ISOSORBIDE MONONITRATE ER 60 MG PO TB24
30.0000 mg | ORAL_TABLET | Freq: Every day | ORAL | Status: DC
  Administered 2020-11-25 – 2020-11-27 (×3): 30 mg via ORAL
  Filled 2020-11-24 (×3): qty 1

## 2020-11-24 MED ORDER — ALBUTEROL (5 MG/ML) CONTINUOUS INHALATION SOLN
10.0000 mg/h | INHALATION_SOLUTION | Freq: Once | RESPIRATORY_TRACT | Status: AC
  Administered 2020-11-24: 10 mg/h via RESPIRATORY_TRACT

## 2020-11-24 MED ORDER — IPRATROPIUM BROMIDE 0.02 % IN SOLN
0.5000 mg | Freq: Once | RESPIRATORY_TRACT | Status: AC
  Administered 2020-11-24: 0.5 mg via RESPIRATORY_TRACT
  Filled 2020-11-24: qty 2.5

## 2020-11-24 MED ORDER — METHYLPREDNISOLONE SODIUM SUCC 125 MG IJ SOLR
60.0000 mg | Freq: Two times a day (BID) | INTRAMUSCULAR | Status: AC
  Administered 2020-11-24 – 2020-11-25 (×2): 60 mg via INTRAVENOUS
  Filled 2020-11-24 (×2): qty 2

## 2020-11-24 MED ORDER — ONDANSETRON HCL 4 MG/2ML IJ SOLN: 4.0000 mg | Freq: Four times a day (QID) | INTRAMUSCULAR | Status: DC | PRN

## 2020-11-24 MED ORDER — PANTOPRAZOLE SODIUM 40 MG PO TBEC
40.0000 mg | DELAYED_RELEASE_TABLET | Freq: Every day | ORAL | Status: DC
  Administered 2020-11-24 – 2020-11-27 (×3): 40 mg via ORAL
  Filled 2020-11-24 (×3): qty 1

## 2020-11-24 MED ORDER — OXYCODONE HCL 5 MG PO TABS
5.0000 mg | ORAL_TABLET | Freq: Four times a day (QID) | ORAL | Status: DC | PRN
  Filled 2020-11-24: qty 1

## 2020-11-24 NOTE — ED Provider Notes (Signed)
[facility] EMERGENCY DEPARTMENT Provider Note   CSN: [id number] Arrival date & time: 11/24/20  7619     History Chief Complaint  Patient presents with   Shortness of Breath    Dustin Fuller is a 60 y.o. male.   Shortness of Breath  Patient presents to the ED with complaints of shortness of breath.  Patient states he started to have trouble last night.  He began feeling more more short of breath and this morning it was very severe.  He tried taking a breathing treatment at home but it did not help.  He had to call EMS.  He denies any trouble with nausea vomiting or diarrhea.  No fevers or chills.  No trouble with leg swelling  Past Medical History:  Diagnosis Date   Bell's palsy    COPD (chronic obstructive pulmonary disease) (HCC)    Essential hypertension    Glaucoma    History of atrial fibrillation    Reported ablation - [facility]   History of cocaine abuse (HCC)    Mixed hyperlipidemia    Stroke (HCC) 2019    Patient Active Problem List   Diagnosis Date Noted   Stroke (HCC) 10/17/2020   Hypertensive crisis 10/17/2020   TIA (transient ischemic attack) 12/18/2018   Right arm weakness    Facial droop 08/31/2017   Tobacco abuse 08/31/2017   COPD (chronic obstructive pulmonary disease) (HCC) 08/31/2017   HTN (hypertension) 08/31/2017    Past Surgical History:  Procedure Laterality Date   CATARACT EXTRACTION     both eyes   glacoma     right eye       Family History  Problem Relation Age of Onset   Cancer Mother     Social History   Tobacco Use   Smoking status: Every Day    Packs/day: 0.25    Types: Cigarettes   Smokeless tobacco: Never  Vaping Use   Vaping Use: Never used  Substance Use Topics   Alcohol use: Not Currently   Drug use: Not Currently    Types: Cocaine    Comment: currently in rehab & recovery as of 10/17/20    Home Medications Prior to Admission medications   Medication Sig Start Date End Date Taking? Authorizing  Provider  albuterol (PROVENTIL HFA) 108 (90 Base) MCG/ACT inhaler Inhale 2 puffs into the lungs every 4 (four) hours as needed for wheezing or shortness of breath. 12/18/18   Shon Hale, MD  apixaban (ELIQUIS) 5 MG TABS tablet Take 1 tablet (5 mg total) by mouth 2 (two) times daily. 12/18/18   Shon Hale, MD  atorvastatin (LIPITOR) 40 MG tablet Take 1 tablet (40 mg total) by mouth every evening. 10/18/20   Danford, Earl Lites, MD  carvedilol (COREG) 25 MG tablet Take 1 tablet (25 mg total) by mouth 2 (two) times daily with a meal. 10/18/20   Danford, Earl Lites, MD  furosemide (LASIX) 20 MG tablet Take 20 mg by mouth daily.  08/15/17   [provider]  hydrALAZINE (APRESOLINE) 50 MG tablet Take 1 tablet (50 mg total) by mouth in the morning and at bedtime. 10/18/20   Danford, Earl Lites, MD  isosorbide mononitrate (IMDUR) 30 MG 24 hr tablet Take 1 tablet (30 mg total) by mouth daily. 10/18/20 10/18/21  Danford, Earl Lites, MD  SYMBICORT 80-4.5 MCG/ACT inhaler Inhale 2 puffs into the lungs 2 (two) times daily. 12/18/18   Shon Hale, MD    Allergies    Diltiazem, Penicillins, Tiotropium  bromide monohydrate, and Lisinopril  Review of Systems   Review of Systems  Respiratory:  Positive for shortness of breath.   All other systems reviewed and are negative.  Physical Exam Updated Vital Signs BP (!) 155/79   Pulse 78   Temp 97.7 F (36.5 C) (Oral)   Resp (!) 22   Ht 1.803 m (5\' 11" )   Wt 104.3 kg   SpO2 91%   BMI 32.08 kg/m   Physical Exam Vitals and nursing note reviewed.  Constitutional:      General: He is in acute distress.     Appearance: He is well-developed. He is ill-appearing.  HENT:     Head: Normocephalic and atraumatic.     Right Ear: External ear normal.     Left Ear: External ear normal.  Eyes:     General: No scleral icterus.       Right eye: No discharge.        Left eye: No discharge.     Conjunctiva/sclera: Conjunctivae normal.   Neck:     Trachea: No tracheal deviation.  Cardiovascular:     Rate and Rhythm: Normal rate and regular rhythm.  Pulmonary:     Effort: Tachypnea and accessory muscle usage present.     Breath sounds: No stridor. Wheezing present. No rales.  Abdominal:     General: Bowel sounds are normal. There is no distension.     Palpations: Abdomen is soft.     Tenderness: There is no abdominal tenderness. There is no guarding or rebound.  Musculoskeletal:        General: No tenderness or deformity.     Cervical back: Neck supple.     Right lower leg: No tenderness. No edema.     Left lower leg: No tenderness. No edema.  Skin:    General: Skin is warm and dry.     Findings: No rash.  Neurological:     General: No focal deficit present.     Mental Status: He is alert.     Cranial Nerves: No cranial nerve deficit (no facial droop, extraocular movements intact, no slurred speech).     Sensory: No sensory deficit.     Motor: No abnormal muscle tone or seizure activity.     Coordination: Coordination normal.  Psychiatric:        Mood and Affect: Mood normal.    ED Results / Procedures / Treatments   Labs (all labs ordered are listed, but only abnormal results are displayed) Labs Reviewed  BASIC METABOLIC PANEL - Abnormal; Notable for the following components:      Result Value   Glucose, Bld 108 (*)    All other components within normal limits  BRAIN NATRIURETIC PEPTIDE - Abnormal; Notable for the following components:   B Natriuretic Peptide 225.0 (*)    All other components within normal limits  RESP PANEL BY RT-PCR (FLU A&B, COVID) [id number]  CBC  TROPONIN I (HIGH SENSITIVITY)  TROPONIN I (HIGH SENSITIVITY)    EKG EKG Interpretation  Date/Time:  Monday November 24 2020 07:26:02 EDT Ventricular Rate:  75 PR Interval:  193 QRS Duration: 88 QT Interval:  390 QTC Calculation: 436 R Axis:   23 Text Interpretation: Sinus rhythm Biatrial enlargement No significant change since last  tracing Confirmed by 07-16-1975 [phone]) on 11/24/2020 7:51:08 AM  Radiology DG Chest Port 1 View  Result Date: 11/24/2020 CLINICAL DATA:  60 year old male with history of dyspnea and productive cough. EXAM: PORTABLE CHEST 1  VIEW COMPARISON:  Chest x-ray 10/17/2020. FINDINGS: Lung volumes are normal. Mild diffuse peribronchial cuffing. No consolidative airspace disease. No pleural effusions. No pneumothorax. No pulmonary nodule or mass noted. Pulmonary vasculature and the cardiomediastinal silhouette are within normal limits. Atherosclerosis in the thoracic aorta. IMPRESSION: 1. Mild diffuse peribronchial cuffing, similar to the prior study, suggestive of chronic bronchitis. 2. Aortic atherosclerosis. Electronically Signed   By: Trudie Reed M.D.   On: 11/24/2020 08:02    Procedures .Critical Care Performed by: Linwood Dibbles, MD Authorized by: Linwood Dibbles, MD   Critical care provider statement:    Critical care time (minutes):  30   Critical care was time spent personally by me on the following activities:  Discussions with consultants, evaluation of patient's response to treatment, examination of patient, ordering and performing treatments and interventions, ordering and review of laboratory studies, ordering and review of radiographic studies, pulse oximetry, re-evaluation of patient's condition, obtaining history from patient or surrogate and review of old charts   Medications Ordered in ED Medications  nitroGLYCERIN (NITROGLYN) 2 % ointment 1 inch (1 inch Topical Given 11/24/20 0816)  carvedilol (COREG) tablet 25 mg (25 mg Oral Given 11/24/20 0801)  albuterol (PROVENTIL,VENTOLIN) solution continuous neb (has no administration in time range)  albuterol (PROVENTIL,VENTOLIN) solution continuous neb (10 mg/hr Nebulization Given 11/24/20 0818)  ipratropium (ATROVENT) nebulizer solution 0.5 mg (0.5 mg Nebulization Given 11/24/20 0818)  methylPREDNISolone sodium succinate (SOLU-MEDROL) 125 mg/2 mL  injection 125 mg (125 mg Intravenous Given 11/24/20 0805)  magnesium sulfate IVPB 1 g 100 mL (0 g Intravenous Stopped 11/24/20 0903)  hydrALAZINE (APRESOLINE) injection 20 mg (20 mg Intravenous Given 11/24/20 0803)  ipratropium-albuterol (DUONEB) 0.5-2.5 (3) MG/3ML nebulizer solution 3 mL (3 mLs Nebulization Given 11/24/20 7619)    ED Course  I have reviewed the triage vital signs and the nursing notes.  Pertinent labs & imaging results that were available during my care of the patient were reviewed by me and considered in my medical decision making (see chart for details).  Clinical Course as of 11/24/20 0936  Mon Nov 24, 2020  5093 Chest x-ray without signs of pneumonia.  Not suggestive of pulmonary edema [JK]  0904 Patient did not tolerate the BiPAP.  Noted to be diaphoretic.  Patient was not on any supplemental oxygen and not receiving his breathing treatment.  Requested a stat DuoNeb as the hour-long neb had not been administered [JK]  0927 Patient states he received 1 neb although respiratory therapy states they have given him the hour-long in his DuoNeb.  We will proceed with additional treatment.  Patient continues to feel short of breath.  I will consult with medical service for admission [JK]  0934 Diaphoresis has resolved.  Patient still has wheezing on exam but work of breathing is decreasing [JK]  0935 Labs reviewed.  CBC and metabolic panel unremarkable.  Troponin is normal [JK]    Clinical Course User Index [JK] Linwood Dibbles, MD   MDM Rules/Calculators/A&P                           Patient presents ED with complaints of shortness of breath.  History of COPD as well as CHF.  Patient was also notably hypertensive but he was not in distress and had not taken his blood pressure medications.  Patient's ED work-up is suggesting of a COPD exacerbation.  No signs of pulmonary edema on x-ray.  BNP is only mildly elevated.  No evidence  of pneumonia on x-ray.  Patient's troponin is not  elevated.  No findings to suggest acute coronary syndrome or ischemia.  Blood pressure has improved with treatment.  We will continue with additional breathing treatments.  I will consult the medical service for admission Final Clinical Impression(s) / ED Diagnoses Final diagnoses:  COPD exacerbation (HCC)     Linwood Dibbles, MD 11/24/20 [phone]

## 2020-11-24 NOTE — ED Notes (Addendum)
Pt removed biPap due to feeling like he was going to be sick. Refused to put back on, states he is feeling a little better with breathing and chest does not feel as tight. 02 sats 95% on room air at this time. Will make MD aware.

## 2020-11-24 NOTE — H&P (Signed)
History and Physical    Dustin Fuller TUU:[id number] DOB: 09-Apr-1960 DOA: 11/24/2020  Referring MD/NP/PA: Linwood Dibbles MD PCP: Ponciano Ort [facility] Patient coming from: Home  Chief Complaint: shortness of breath / copd  HPI: Dustin Fuller is a 60 y.o. male with medical history significant of copd, htn, hyperlipidemia, cva, and afib. He presents 0700 on 11/24/20 with shortness of breath since last night. He reports taking a breathing treatment at home without improvement. He denies fevers, chills, nausea, vomiting, productive cough, hemoptysis. He reports an occasional dry nonproductive cough. He initially was notable wheezing in all fields, currently is mildly wheezing in all fields with good air movement in all fields. No jvd, no pedal edema, no lower extremity edema. Reports he uses his inhaled corticosteroids as prescribed, still smokes 0.25 ppd, plans to quit with this most recent exacerbation.  Reports infrequent use of MDI, possibly 1 use per week.   ED Course: As he was initially hypertensive, reports he did not take his medications this morning and is also in respiratory distress, 212/120 with a heart rate of 87, IV hydralazine was given, as well as transdermal ntg with consideration of CHF history, carvedilol, mag sulfate, duoneb, albuterol, atrovent, and methylprednisolone. Bipap was attempted at one point, which he did not tolerate.   Review of Systems: As per HPI otherwise 10 point review of systems negative.   Past Medical History:  Diagnosis Date   Bell's palsy    COPD (chronic obstructive pulmonary disease) (HCC)    Essential hypertension    Glaucoma    History of atrial fibrillation    Reported ablation - [facility]   History of cocaine abuse (HCC)    Mixed hyperlipidemia    Stroke (HCC) 2019    Past Surgical History:  Procedure Laterality Date   CATARACT EXTRACTION     both eyes   glacoma     right eye     reports that he has been smoking cigarettes. He  has been smoking an average of .25 packs per day. He has never used smokeless tobacco. He reports that he does not currently use alcohol. He reports that he does not currently use drugs after having used the following drugs: Cocaine.  Allergies  Allergen Reactions   Diltiazem Other (See Comments)    IV   Lisinopril Swelling    angioedema   Penicillins Other (See Comments)   Tiotropium Bromide Monohydrate Other (See Comments)    Reports 'throat swells'    Family History  Problem Relation Age of Onset   Cancer Mother    Unacceptable: Noncontributory, unremarkable, or negative. Acceptable: Family history reviewed and not pertinent (If you reviewed it)  Prior to Admission medications   Medication Sig Start Date End Date Taking? Authorizing Provider  albuterol (PROVENTIL HFA) 108 (90 Base) MCG/ACT inhaler Inhale 2 puffs into the lungs every 4 (four) hours as needed for wheezing or shortness of breath. 12/18/18  Yes Emokpae, Courage, MD  apixaban (ELIQUIS) 5 MG TABS tablet Take 1 tablet (5 mg total) by mouth 2 (two) times daily. 12/18/18  Yes Shon Hale, MD  atorvastatin (LIPITOR) 40 MG tablet Take 1 tablet (40 mg total) by mouth every evening. 10/18/20  Yes Danford, Earl Lites, MD  carvedilol (COREG) 25 MG tablet Take 1 tablet (25 mg total) by mouth 2 (two) times daily with a meal. 10/18/20  Yes Danford, Earl Lites, MD  furosemide (LASIX) 20 MG tablet Take 20 mg by mouth daily.  08/15/17  Yes [provider]  hydrALAZINE (APRESOLINE) 50 MG tablet Take 1 tablet (50 mg total) by mouth in the morning and at bedtime. 10/18/20  Yes Danford, Earl Lites, MD  isosorbide mononitrate (IMDUR) 30 MG 24 hr tablet Take 1 tablet (30 mg total) by mouth daily. 10/18/20 10/18/21 Yes Danford, Earl Lites, MD  SYMBICORT 80-4.5 MCG/ACT inhaler Inhale 2 puffs into the lungs 2 (two) times daily. 12/18/18  Yes Shon Hale, MD    Physical Exam: Vitals:   11/24/20 1030 11/24/20 1106  11/24/20 1130 11/24/20 1245  BP: 137/77  (!) 141/73   Pulse: 68  67 72  Resp: (!) 29     Temp:      TempSrc:      SpO2: 92% 92% 94% 98%  Weight:      Height:          Constitutional: NAD, calm, comfortable Vitals:   11/24/20 1030 11/24/20 1106 11/24/20 1130 11/24/20 1245  BP: 137/77  (!) 141/73   Pulse: 68  67 72  Resp: (!) 29     Temp:      TempSrc:      SpO2: 92% 92% 94% 98%  Weight:      Height:       Eyes: PERRL, lids and conjunctivae normal  ENMT: Mucous membranes are moist. Posterior pharynx clear of any exudate or lesions.Normal dentition.   Neck: normal, supple, no masses, no thyromegaly  Respiratory: good air movement bilaterally, mild wheezing in all fields, no crackles. Normal respiratory effort. No accessory muscle use.   Cardiovascular: Regular rate and rhythm, no murmurs / rubs / gallops. No extremity edema. 2+ pedal pulses. No carotid bruits.   Abdomen: no tenderness, no masses palpated. No hepatosplenomegaly. Bowel sounds positive.   Musculoskeletal: no clubbing / cyanosis. No joint deformity upper and lower extremities. Good ROM, no contractures. Normal muscle tone.   Skin: no obvious rashes, lesions, ulcers. No induration  Neurologic: CN 2-12 grossly intact. Sensation intact. Moves all extremities appropriately.  Psychiatric: Normal judgment and insight. Alert and oriented x 3. Normal mood.    Labs on Admission: I have personally reviewed following labs and imaging studies  CBC: Recent Labs  Lab 11/24/20 0812  WBC 9.1  HGB 16.2  HCT 48.0  MCV 95.0  PLT 193   Basic Metabolic Panel: Recent Labs  Lab 11/24/20 0812  NA 137  K 4.3  CL 101  CO2 29  GLUCOSE 108*  BUN 13  CREATININE 0.82  CALCIUM 9.2   GFR: Estimated Creatinine Clearance: 117.8 mL/min (by C-G formula based on SCr of 0.82 mg/dL). Liver Function Tests: No results for input(s): AST, ALT, ALKPHOS, BILITOT, PROT, ALBUMIN in the last 168 hours. No results for input(s):  LIPASE, AMYLASE in the last 168 hours. No results for input(s): AMMONIA in the last 168 hours. Coagulation Profile: No results for input(s): INR, PROTIME in the last 168 hours. Cardiac Enzymes: No results for input(s): CKTOTAL, CKMB, CKMBINDEX, TROPONINI in the last 168 hours. BNP (last 3 results) No results for input(s): PROBNP in the last 8760 hours. HbA1C: No results for input(s): HGBA1C in the last 72 hours. CBG: No results for input(s): GLUCAP in the last 168 hours. Lipid Profile: No results for input(s): CHOL, HDL, LDLCALC, TRIG, CHOLHDL, LDLDIRECT in the last 72 hours. Thyroid Function Tests: No results for input(s): TSH, T4TOTAL, FREET4, T3FREE, THYROIDAB in the last 72 hours. Anemia Panel: No results for input(s): VITAMINB12, FOLATE, FERRITIN, TIBC, IRON, RETICCTPCT in the last 72 hours.  Urine analysis:    Component Value Date/Time   COLORURINE STRAW (A) 12/18/2018 0853   APPEARANCEUR CLEAR 12/18/2018 0853   LABSPEC 1.005 12/18/2018 0853   PHURINE 5.0 12/18/2018 0853   GLUCOSEU NEGATIVE 12/18/2018 0853   HGBUR NEGATIVE 12/18/2018 0853   BILIRUBINUR NEGATIVE 12/18/2018 0853   KETONESUR NEGATIVE 12/18/2018 0853   PROTEINUR NEGATIVE 12/18/2018 0853   NITRITE NEGATIVE 12/18/2018 0853   LEUKOCYTESUR NEGATIVE 12/18/2018 0853   Sepsis Labs: @LABRCNTIP (procalcitonin:4,lacticidven:4) ) Recent Results (from the past 240 hour(s))  Resp Panel by RT-PCR (Flu A&B, Covid) Nasopharyngeal Swab     Status: None   Collection Time: 11/24/20  7:39 AM   Specimen: Nasopharyngeal Swab; Nasopharyngeal(NP) swabs in vial transport medium  Result Value Ref Range Status   SARS Coronavirus 2 by RT PCR NEGATIVE NEGATIVE Final    Comment: (NOTE) SARS-CoV-2 target nucleic acids are NOT DETECTED.  The SARS-CoV-2 RNA is generally detectable in upper respiratory specimens during the acute phase of infection. The lowest concentration of SARS-CoV-2 viral copies this assay can detect is 138  copies/mL. A negative result does not preclude SARS-Cov-2 infection and should not be used as the sole basis for treatment or other patient management decisions. A negative result may occur with  improper specimen collection/handling, submission of specimen other than nasopharyngeal swab, presence of viral mutation(s) within the areas targeted by this assay, and inadequate number of viral copies(<138 copies/mL). A negative result must be combined with clinical observations, patient history, and epidemiological information. The expected result is Negative.  Fact Sheet for Patients:  01/24/21  Fact Sheet for Healthcare Providers:  BloggerCourse.com  This test is no t yet approved or cleared by the [employer] FDA and  has been authorized for detection and/or diagnosis of SARS-CoV-2 by FDA under an Emergency Use Authorization (EUA). This EUA will remain  in effect (meaning this test can be used) for the duration of the COVID-19 declaration under Section 564(b)(1) of the Act, 21 U.S.C.section 360bbb-3(b)(1), unless the authorization is terminated  or revoked sooner.       Influenza A by PCR NEGATIVE NEGATIVE Final   Influenza B by PCR NEGATIVE NEGATIVE Final    Comment: (NOTE) The Xpert Xpress SARS-CoV-2/FLU/RSV plus assay is intended as an aid in the diagnosis of influenza from Nasopharyngeal swab specimens and should not be used as a sole basis for treatment. Nasal washings and aspirates are unacceptable for Xpert Xpress SARS-CoV-2/FLU/RSV testing.  Fact Sheet for Patients: [place]  Fact Sheet for Healthcare Providers: BloggerCourse.com  This test is not yet approved or cleared by the [employer]ious[employer].[place] FDA and has been authorized for detection and/or diagnosis of SARS-CoV-2 by FDA under an Emergency Use Authorization (EUA). This EUA will remain in effect (meaning  this test can be used) for the duration of the COVID-19 declaration under Section 564(b)(1) of the Act, 21 U.S.C. section 360bbb-3(b)(1), unless the authorization is terminated or revoked.  Performed at [facility], [address] [place], [place]      Radiological Exams on Admission: DG Chest Port 1 View  Result Date: 11/24/2020 CLINICAL DATA:  60 year old male with history of dyspnea and productive cough. EXAM: PORTABLE CHEST 1 VIEW COMPARISON:  Chest x-ray 10/17/2020. FINDINGS: Lung volumes are normal. Mild diffuse peribronchial cuffing. No consolidative airspace disease. No pleural effusions. No pneumothorax. No pulmonary nodule or mass noted. Pulmonary vasculature and the cardiomediastinal silhouette are within normal limits. Atherosclerosis in the thoracic aorta. IMPRESSION: 1. Mild diffuse peribronchial cuffing, similar to the  prior study, suggestive of chronic bronchitis. 2. Aortic atherosclerosis. Electronically Signed   By: Trudie Reed M.D.   On: 11/24/2020 08:02    EKG: Independently reviewed. Sinus rhythm, no st segment changes noted, both atrial hypertrophy noted, no suggestion of ischemia.  Assessment/Plan Principal Problem:   COPD with acute exacerbation -albuterol/atrovent/duoneb was given -continuous albuterol was given -IV solumedrol was given -IV mag sulfate was given -Bipap was not tolerated -pt remains of 4lpm o2 via nasal canula, not on home oxygen -continue home albuterol MDI prn -continue duoneb prn -continue home ICS bid -tobacco cessation  Active Problems:    Tobacco abuse -tobacco cessation resources -nicotine patch    COPD (chronic obstructive pulmonary disease) -continue home ICS -continue home albuterol mdi and neb -consider home ipratropium neb / duoneb     HTN (hypertension) -continue home carvedilol 25mg  bid -consider ARB? -continue home hydralazine -Continue home Imdur    Right arm weakness -did not note 10/3     Stroke -continue home apixaban    History of atrial fibrillation -continue home apixaban -currently in normal sinus rhythm     DVT prophylaxis: apixaban Code Status: full Family Communication:  Disposition Plan: observation,  Consults called: none Admission status: obs  Severity of Illness: The appropriate patient status for this patient is OBSERVATION. Observation status is judged to be reasonable and necessary in order to provide the required intensity of service to ensure the patient's safety. The patient's presenting symptoms, physical exam findings, and initial radiographic and laboratory data in the context of their medical condition is felt to place them at decreased risk for further clinical deterioration. Furthermore, it is anticipated that the patient will be medically stable for discharge from the hospital within 2 midnights of admission. The following factors support the patient status of observation.   " The patient's presenting symptoms include shortness of breath " The physical exam findings include wheezing in all lung fields " The initial radiographic and laboratory data are hypoxia with exertion upon initial presentation without signs of infectious pulmonary processes   Dustin Fuller student [facility] [employer] Pager [phone]   If 7PM-7AM, please contact night-coverage [email or web address] Password [email or web address]  11/24/2020, 1:23 PM

## 2020-11-24 NOTE — ED Notes (Addendum)
Pt sats dropped to 80's and pt is diaphoretic and anxious. Nurse requested nausea and anxiety medications. ED provider to bedside to evaluate patient. RT called to bedside for DuoNeb treatment per MD.  Pt on 4L 02 via [place] at this time.

## 2020-11-24 NOTE — ED Notes (Signed)
RT at bedside. Pt given breathing treatments and put on BiPap.

## 2020-11-24 NOTE — ED Triage Notes (Signed)
Pt from home, states trouble breathing started last night. Does not feel like fluid. Pt took a breathing treatment at home with no relief. 02 sats 95 on room air. Denies N/V/D.

## 2020-11-25 DIAGNOSIS — J441 Chronic obstructive pulmonary disease with (acute) exacerbation: Secondary | ICD-10-CM

## 2020-11-25 DIAGNOSIS — H409 Unspecified glaucoma: Secondary | ICD-10-CM | POA: Diagnosis present

## 2020-11-25 DIAGNOSIS — Z880 Allergy status to penicillin: Secondary | ICD-10-CM | POA: Diagnosis not present

## 2020-11-25 DIAGNOSIS — Z7901 Long term (current) use of anticoagulants: Secondary | ICD-10-CM | POA: Diagnosis not present

## 2020-11-25 DIAGNOSIS — Z888 Allergy status to other drugs, medicaments and biological substances status: Secondary | ICD-10-CM | POA: Diagnosis not present

## 2020-11-25 DIAGNOSIS — G510 Bell's palsy: Secondary | ICD-10-CM | POA: Diagnosis present

## 2020-11-25 DIAGNOSIS — J9621 Acute and chronic respiratory failure with hypoxia: Secondary | ICD-10-CM | POA: Diagnosis present

## 2020-11-25 DIAGNOSIS — Z720 Tobacco use: Secondary | ICD-10-CM | POA: Diagnosis not present

## 2020-11-25 DIAGNOSIS — Z8673 Personal history of transient ischemic attack (TIA), and cerebral infarction without residual deficits: Secondary | ICD-10-CM | POA: Diagnosis not present

## 2020-11-25 DIAGNOSIS — Z9114 Patient's other noncompliance with medication regimen: Secondary | ICD-10-CM | POA: Diagnosis not present

## 2020-11-25 DIAGNOSIS — E782 Mixed hyperlipidemia: Secondary | ICD-10-CM | POA: Diagnosis present

## 2020-11-25 DIAGNOSIS — J9601 Acute respiratory failure with hypoxia: Secondary | ICD-10-CM | POA: Diagnosis present

## 2020-11-25 DIAGNOSIS — Z20822 Contact with and (suspected) exposure to covid-19: Secondary | ICD-10-CM | POA: Diagnosis present

## 2020-11-25 DIAGNOSIS — I10 Essential (primary) hypertension: Secondary | ICD-10-CM | POA: Diagnosis present

## 2020-11-25 DIAGNOSIS — F1410 Cocaine abuse, uncomplicated: Secondary | ICD-10-CM | POA: Diagnosis present

## 2020-11-25 DIAGNOSIS — I480 Paroxysmal atrial fibrillation: Secondary | ICD-10-CM | POA: Diagnosis present

## 2020-11-25 DIAGNOSIS — F17210 Nicotine dependence, cigarettes, uncomplicated: Secondary | ICD-10-CM | POA: Diagnosis present

## 2020-11-25 LAB — CBC WITH DIFFERENTIAL/PLATELET
Abs Immature Granulocytes: 0.09 10*3/uL — ABNORMAL HIGH (ref 0.00–0.07)
Basophils Absolute: 0 10*3/uL (ref 0.0–0.1)
Basophils Relative: 0 %
Eosinophils Absolute: 0 10*3/uL (ref 0.0–0.5)
Eosinophils Relative: 0 %
HCT: 46.7 % (ref 39.0–52.0)
Hemoglobin: 15.7 g/dL (ref 13.0–17.0)
Immature Granulocytes: 1 %
Lymphocytes Relative: 5 %
Lymphs Abs: 0.6 10*3/uL — ABNORMAL LOW (ref 0.7–4.0)
MCH: 31.7 pg (ref 26.0–34.0)
MCHC: 33.6 g/dL (ref 30.0–36.0)
MCV: 94.3 fL (ref 80.0–100.0)
Monocytes Absolute: 0.4 10*3/uL (ref 0.1–1.0)
Monocytes Relative: 3 %
Neutro Abs: 12 10*3/uL — ABNORMAL HIGH (ref 1.7–7.7)
Neutrophils Relative %: 91 %
Platelets: 197 10*3/uL (ref 150–400)
RBC: 4.95 MIL/uL (ref 4.22–5.81)
RDW: 13.3 % (ref 11.5–15.5)
WBC: 13.2 10*3/uL — ABNORMAL HIGH (ref 4.0–10.5)
nRBC: 0 % (ref 0.0–0.2)

## 2020-11-25 LAB — COMPREHENSIVE METABOLIC PANEL
ALT: 22 U/L (ref 0–44)
AST: 13 U/L — ABNORMAL LOW (ref 15–41)
Albumin: 4.1 g/dL (ref 3.5–5.0)
Alkaline Phosphatase: 57 U/L (ref 38–126)
Anion gap: 8 (ref 5–15)
BUN: 18 mg/dL (ref 6–20)
CO2: 25 mmol/L (ref 22–32)
Calcium: 9.4 mg/dL (ref 8.9–10.3)
Chloride: 104 mmol/L (ref 98–111)
Creatinine, Ser: 0.75 mg/dL (ref 0.61–1.24)
GFR, Estimated: >60 mL/min (ref >60)
Glucose, Bld: 130 mg/dL — ABNORMAL HIGH (ref 70–99)
Potassium: 3.8 mmol/L (ref 3.5–5.1)
Sodium: 137 mmol/L (ref 135–145)
Total Bilirubin: 0.7 mg/dL (ref 0.3–1.2)
Total Protein: 7.4 g/dL (ref 6.5–8.1)

## 2020-11-25 LAB — MAGNESIUM: Magnesium: 1.9 mg/dL (ref 1.7–2.4)

## 2020-11-25 MED ORDER — ALBUTEROL SULFATE (2.5 MG/3ML) 0.083% IN NEBU: 2.5000 mg | INHALATION_SOLUTION | RESPIRATORY_TRACT | Status: DC | PRN

## 2020-11-25 MED ORDER — METHYLPREDNISOLONE SODIUM SUCC 125 MG IJ SOLR
60.0000 mg | Freq: Two times a day (BID) | INTRAMUSCULAR | Status: DC
  Administered 2020-11-25 – 2020-11-27 (×4): 60 mg via INTRAVENOUS
  Filled 2020-11-25 (×4): qty 2

## 2020-11-25 NOTE — Progress Notes (Signed)
Patient home meds taken and and placed in pharmacy by [facility].

## 2020-11-25 NOTE — Progress Notes (Signed)
PROGRESS NOTE    [name]  XBD:[id number] DOB: 1960/05/11 DOA: 11/24/2020 PCP: Ponciano Ort [facility]     Brief Narrative: Dustin Fuller is a 60 y.o. male with medical history significant of copd, htn, hyperlipidemia, cva, and afib. He presents 0700 on 11/24/20 with shortness of breath since last night. He reports taking a breathing treatment at home without improvement. He denies fevers, chills, nausea, vomiting, productive cough, hemoptysis. He reports an occasional dry nonproductive cough. He initially 10/3 was notable wheezing in all fields,  10/4 mildly wheezing in all fields with reduced air movement in all fields. No jvd, no pedal edema, no lower extremity edema. Reports he uses his inhaled corticosteroids as prescribed, still smokes 0.25 ppd, plans to quit with this most recent exacerbation.  Reports infrequent use of MDI, possibly 1 use per week.  Patient has continued wheezing 10/4 and decreased air movement. He now remembers that the onset of his respiratory distress was while using oven cleaner, possibly getting exposed to the aerosol. If his presumed copd exacerbation does not respond to usual copd exacerbation treatment, we may pursue a route of possible inhalation of irritating / caustic substances. He is currently not in acute distress.  With this continued lack of resolution, he has been made inpatient.   Assessment & Plan:   Principal Problem:   COPD with acute exacerbation -continue duoneb  -continue dulera -continue doxycycline -continue prednisone 40mg  -currently on 2lpm o2 via nasal canula, not normally on home oxygen,  -wean o2 when lung sounds and air movement are improved  Active Problems: Possible inhalation of irritating substance -will investigate further if his shortness of breath does not improve  COPD (chronic obstructive pulmonary disease) -continue duoneb -continue dulera -continue doxycycline -continue prednisone    HTN  (hypertension) -continue home imdur -continue home carvedilol -continue home furosemide      Stroke -continue home atorvastatin    History of atrial fibrillation -continue home apixaban    Tobacco abuse -pt plans to quit, has nicotine patches    Glaucoma    DVT prophylaxis: apixaban Code Status: full Family Communication:  Disposition Plan: hopefully improve in the next day to two days, if not, considering pulmonary consult for possible inhalation injury if not responsive to COPD exacerbation treatments   Consultants:    Procedures:   Subjective: Patient reports he is breathing better, still 'tight' as in unable to get a deep breath.  Objective: Vitals:   11/25/20 0142 11/25/20 0334 11/25/20 0812 11/25/20 1244  BP:  (!) 165/90  (!) 156/85  Pulse:  82  77  Resp:  19  18  Temp:  97.9 F (36.6 C)  98.7 F (37.1 C)  TempSrc:  Oral  Oral  SpO2: 97% 94% 94% 93%  Weight:      Height:        Intake/Output Summary (Last 24 hours) at 11/25/2020 1252 Last data filed at 11/25/2020 0900 Gross per 24 hour  Intake 240 ml  Output --  Net 240 ml   Filed Weights   11/24/20 0730 11/24/20 1935  Weight: 104.3 kg 101 kg    Examination:  General exam: Appears calm and comfortable   Respiratory system: Diffusely moderate-mild wheeze, with poor air exchange, but not silent, just reduced and impacting his effort.  Cardiovascular system: S1 & S2 heard, RRR. No JVD, murmurs, rubs, gallops or clicks. No pedal edema.  Gastrointestinal system: Abdomen is nondistended, soft and nontender. No organomegaly or masses felt. Normal bowel sounds heard.  Central nervous system: Alert and oriented. No focal neurological deficits.  Extremities: Moves all extremities appropriately.  Skin: No obvious rashes, lesions or ulcers  Psychiatry: Judgement and insight appear normal. Mood & affect appropriate.     Data Reviewed: I have personally reviewed following labs and imaging  studies  CBC: Recent Labs  Lab 11/24/20 0812 11/25/20 0604  WBC 9.1 13.2*  NEUTROABS  --  12.0*  HGB 16.2 15.7  HCT 48.0 46.7  MCV 95.0 94.3  PLT 193 197   Basic Metabolic Panel: Recent Labs  Lab 11/24/20 0812 11/25/20 0604  NA 137 137  K 4.3 3.8  CL 101 104  CO2 29 25  GLUCOSE 108* 130*  BUN 13 18  CREATININE 0.82 0.75  CALCIUM 9.2 9.4  MG  --  1.9   GFR: Estimated Creatinine Clearance: 118.9 mL/min (by C-G formula based on SCr of 0.75 mg/dL). Liver Function Tests: Recent Labs  Lab 11/25/20 0604  AST 13*  ALT 22  ALKPHOS 57  BILITOT 0.7  PROT 7.4  ALBUMIN 4.1   No results for input(s): LIPASE, AMYLASE in the last 168 hours. No results for input(s): AMMONIA in the last 168 hours. Coagulation Profile: No results for input(s): INR, PROTIME in the last 168 hours. Cardiac Enzymes: No results for input(s): CKTOTAL, CKMB, CKMBINDEX, TROPONINI in the last 168 hours. BNP (last 3 results) No results for input(s): PROBNP in the last 8760 hours. HbA1C: No results for input(s): HGBA1C in the last 72 hours. CBG: No results for input(s): GLUCAP in the last 168 hours. Lipid Profile: No results for input(s): CHOL, HDL, LDLCALC, TRIG, CHOLHDL, LDLDIRECT in the last 72 hours. Thyroid Function Tests: No results for input(s): TSH, T4TOTAL, FREET4, T3FREE, THYROIDAB in the last 72 hours. Anemia Panel: No results for input(s): VITAMINB12, FOLATE, FERRITIN, TIBC, IRON, RETICCTPCT in the last 72 hours. Urine analysis:    Component Value Date/Time   COLORURINE STRAW (A) 12/18/2018 0853   APPEARANCEUR CLEAR 12/18/2018 0853   LABSPEC 1.005 12/18/2018 0853   PHURINE 5.0 12/18/2018 0853   GLUCOSEU NEGATIVE 12/18/2018 0853   HGBUR NEGATIVE 12/18/2018 0853   BILIRUBINUR NEGATIVE 12/18/2018 0853   KETONESUR NEGATIVE 12/18/2018 0853   PROTEINUR NEGATIVE 12/18/2018 0853   NITRITE NEGATIVE 12/18/2018 0853   LEUKOCYTESUR NEGATIVE 12/18/2018 0853   Sepsis  Labs: @LABRCNTIP (procalcitonin:4,lacticidven:4)  ) Recent Results (from the past 240 hour(s))  Resp Panel by RT-PCR (Flu A&B, Covid) Nasopharyngeal Swab     Status: None   Collection Time: 11/24/20  7:39 AM   Specimen: Nasopharyngeal Swab; Nasopharyngeal(NP) swabs in vial transport medium  Result Value Ref Range Status   SARS Coronavirus 2 by RT PCR NEGATIVE NEGATIVE Final    Comment: (NOTE) SARS-CoV-2 target nucleic acids are NOT DETECTED.  The SARS-CoV-2 RNA is generally detectable in upper respiratory specimens during the acute phase of infection. The lowest concentration of SARS-CoV-2 viral copies this assay can detect is 138 copies/mL. A negative result does not preclude SARS-Cov-2 infection and should not be used as the sole basis for treatment or other patient management decisions. A negative result may occur with  improper specimen collection/handling, submission of specimen other than nasopharyngeal swab, presence of viral mutation(s) within the areas targeted by this assay, and inadequate number of viral copies(<138 copies/mL). A negative result must be combined with clinical observations, patient history, and epidemiological information. The expected result is Negative.  Fact Sheet for Patients:  01/24/21  Fact Sheet for Healthcare Providers:  BloggerCourse.com  This  test is no t yet approved or cleared by the [place] and  has been authorized for detection and/or diagnosis of SARS-CoV-2 by FDA under an Emergency Use Authorization (EUA). This EUA will remain  in effect (meaning this test can be used) for the duration of the COVID-19 declaration under Section 564(b)(1) of the Act, 21 U.S.C.section 360bbb-3(b)(1), unless the authorization is terminated  or revoked sooner.       Influenza A by PCR NEGATIVE NEGATIVE Final   Influenza B by PCR NEGATIVE NEGATIVE Final    Comment: (NOTE) The Xpert  Xpress SARS-CoV-2/FLU/RSV plus assay is intended as an aid in the diagnosis of influenza from Nasopharyngeal swab specimens and should not be used as a sole basis for treatment. Nasal washings and aspirates are unacceptable for Xpert Xpress SARS-CoV-2/FLU/RSV testing.  Fact Sheet for Patients: BloggerCourse.com  Fact Sheet for Healthcare Providers: SeriousBroker.it  This test is not yet approved or cleared by the [place] FDA and has been authorized for detection and/or diagnosis of SARS-CoV-2 by FDA under an Emergency Use Authorization (EUA). This EUA will remain in effect (meaning this test can be used) for the duration of the COVID-19 declaration under Section 564(b)(1) of the Act, 21 U.S.C. section 360bbb-3(b)(1), unless the authorization is terminated or revoked.  Performed at [facility], [address] [place], [place] [address]          Radiology Studies: [facility] Chest [facility] 1 View  Result Date: 11/24/2020 CLINICAL DATA:  60 year old male with history of dyspnea and productive cough. EXAM: PORTABLE CHEST 1 VIEW COMPARISON:  Chest x-ray 10/17/2020. FINDINGS: Lung volumes are normal. Mild diffuse peribronchial cuffing. No consolidative airspace disease. No pleural effusions. No pneumothorax. No pulmonary nodule or mass noted. Pulmonary vasculature and the cardiomediastinal silhouette are within normal limits. Atherosclerosis in the thoracic aorta. IMPRESSION: 1. Mild diffuse peribronchial cuffing, similar to the prior study, suggestive of chronic bronchitis. 2. Aortic atherosclerosis. Electronically Signed   By: Trudie Reed M.D.   On: 11/24/2020 08:02        Scheduled Meds:  apixaban  5 mg Oral BID   atorvastatin  40 mg Oral QPM   carvedilol  25 mg Oral BID WC   doxycycline  100 mg Oral Q12H   furosemide  20 mg Oral Daily   guaiFENesin  1,200 mg Oral BID   hydrALAZINE  50 mg Oral BID   ipratropium-albuterol  3 mL  Nebulization Q6H   isosorbide mononitrate  30 mg Oral Daily   mometasone-formoterol  2 puff Inhalation BID   nicotine  21 mg Transdermal Daily   pantoprazole  40 mg Oral Q0600   [START ON 11/26/2020] predniSONE  40 mg Oral Q breakfast   Continuous Infusions:   LOS: 1 day      Jessice Madill D Daylah Sayavong, student [facility] Pager [phone]  If 7PM-7AM, please contact night-coverage [email or web address] Password [email or web address] 11/25/2020, 12:52 PM

## 2020-11-26 DIAGNOSIS — I480 Paroxysmal atrial fibrillation: Secondary | ICD-10-CM

## 2020-11-26 DIAGNOSIS — Z720 Tobacco use: Secondary | ICD-10-CM | POA: Diagnosis not present

## 2020-11-26 DIAGNOSIS — J9601 Acute respiratory failure with hypoxia: Secondary | ICD-10-CM

## 2020-11-26 DIAGNOSIS — I10 Essential (primary) hypertension: Secondary | ICD-10-CM

## 2020-11-26 DIAGNOSIS — J441 Chronic obstructive pulmonary disease with (acute) exacerbation: Secondary | ICD-10-CM | POA: Diagnosis not present

## 2020-11-26 LAB — CBC WITH DIFFERENTIAL/PLATELET
Abs Immature Granulocytes: 0.16 10*3/uL — ABNORMAL HIGH (ref 0.00–0.07)
Basophils Absolute: 0 10*3/uL (ref 0.0–0.1)
Basophils Relative: 0 %
Eosinophils Absolute: 0 10*3/uL (ref 0.0–0.5)
Eosinophils Relative: 0 %
HCT: 47.1 % (ref 39.0–52.0)
Hemoglobin: 15.2 g/dL (ref 13.0–17.0)
Immature Granulocytes: 1 %
Lymphocytes Relative: 4 %
Lymphs Abs: 0.6 10*3/uL — ABNORMAL LOW (ref 0.7–4.0)
MCH: 30.8 pg (ref 26.0–34.0)
MCHC: 32.3 g/dL (ref 30.0–36.0)
MCV: 95.3 fL (ref 80.0–100.0)
Monocytes Absolute: 0.4 10*3/uL (ref 0.1–1.0)
Monocytes Relative: 3 %
Neutro Abs: 14 10*3/uL — ABNORMAL HIGH (ref 1.7–7.7)
Neutrophils Relative %: 92 %
Platelets: 211 10*3/uL (ref 150–400)
RBC: 4.94 MIL/uL (ref 4.22–5.81)
RDW: 13.8 % (ref 11.5–15.5)
WBC: 15.1 10*3/uL — ABNORMAL HIGH (ref 4.0–10.5)
nRBC: 0 % (ref 0.0–0.2)

## 2020-11-26 LAB — COMPREHENSIVE METABOLIC PANEL
ALT: 19 U/L (ref 0–44)
AST: 14 U/L — ABNORMAL LOW (ref 15–41)
Albumin: 4 g/dL (ref 3.5–5.0)
Alkaline Phosphatase: 57 U/L (ref 38–126)
Anion gap: 8 (ref 5–15)
BUN: 19 mg/dL (ref 6–20)
CO2: 26 mmol/L (ref 22–32)
Calcium: 9.3 mg/dL (ref 8.9–10.3)
Chloride: 104 mmol/L (ref 98–111)
Creatinine, Ser: 0.83 mg/dL (ref 0.61–1.24)
GFR, Estimated: >60 mL/min (ref >60)
Glucose, Bld: 131 mg/dL — ABNORMAL HIGH (ref 70–99)
Potassium: 4.1 mmol/L (ref 3.5–5.1)
Sodium: 138 mmol/L (ref 135–145)
Total Bilirubin: 0.7 mg/dL (ref 0.3–1.2)
Total Protein: 7.4 g/dL (ref 6.5–8.1)

## 2020-11-26 LAB — MAGNESIUM: Magnesium: 2 mg/dL (ref 1.7–2.4)

## 2020-11-26 MED ORDER — ARFORMOTEROL TARTRATE 15 MCG/2ML IN NEBU
15.0000 ug | INHALATION_SOLUTION | Freq: Two times a day (BID) | RESPIRATORY_TRACT | Status: DC
  Administered 2020-11-26 – 2020-11-27 (×2): 15 ug via RESPIRATORY_TRACT
  Filled 2020-11-26 (×2): qty 2

## 2020-11-26 MED ORDER — BUDESONIDE 0.5 MG/2ML IN SUSP
0.5000 mg | Freq: Two times a day (BID) | RESPIRATORY_TRACT | Status: DC
  Administered 2020-11-26 – 2020-11-27 (×2): 0.5 mg via RESPIRATORY_TRACT
  Filled 2020-11-26 (×3): qty 2

## 2020-11-26 NOTE — Plan of Care (Signed)

## 2020-11-26 NOTE — Progress Notes (Signed)
PROGRESS NOTE  Dustin Fuller GLO:[id number] DOB: [birth year] DOA: 11/24/2020 PCP: Ponciano Ort [facility]  Brief History:  60 y.o. male with medical history significant of copd, htn, hyperlipidemia, cva, and afib. He presents 0700 on 11/24/20 with shortness of breath since 10/2. He reports taking a breathing treatment at home without improvement.  He complains of nonproductive cough He denies fevers, chills, nausea, vomiting, cp, hemoptysis. He reports an occasional dry nonproductive cough. He initially was notable wheezing in all fields, currently is mildly wheezing in all fields with good air movement in all fields. No jvd, no pedal edema, no lower extremity edema. Reports he uses his inhaled corticosteroids as prescribed, still smokes 0.1/2 ppd, plans to quit with this most recent exacerbation.  Reports infrequent use of MDI, possibly 1 use per week.     ED Course: As he was initially hypertensive, reports he did not take his medications this morning and is also in respiratory distress, 212/120 with a heart rate of 87, IV hydralazine was given, as well as transdermal ntg with consideration of CHF history, carvedilol, mag sulfate, duoneb, albuterol, atrovent, and methylprednisolone. Bipap was attempted at one point, which he did not tolerate.  He was placed on [place] and remained stable.  Assessment/Plan: Acute Respiratory Failure with Hypoxia -initially on BiPAP -initially saturation 80s -now stable on 2L -wean oxygen to RA  COPD Exacerbation -personally reviewed CXR--chronic bronchitic markings -start pulmicort -start Brovana -continue duoneb -continue IV solumedrol  HTN -continue coreg, hydralazine, imdur  Tobacco Abuse -cessation discussed -nicoderm patch  Paroxysmal Afib -currently in sinus -continue apixaban  Hx of Stroke -continue apixaban  Hyperlipidemia -continue statin      Status is: Inpatient  Remains inpatient appropriate because:Inpatient level  of care appropriate due to severity of illness  Dispo: The patient is from: ALF              Anticipated d/c is to: ALF              Patient currently is not medically stable to d/c.   Difficult to place patient No        Family Communication:   no Family at bedside  Consultants:  none  Code Status:  FULL   DVT Prophylaxis:  apixban   Procedures: As Listed in Progress Note Above  Antibiotics: Doxy 10/3>>        Subjective: Patient states he is breathing better, but remains sob with exertion.  Denies f/c, cp, n/v/d, abd pain.  Has a dry cough.  No hemoptysis  Objective: Vitals:   11/26/20 0153 11/26/20 0530 11/26/20 0752 11/26/20 0804  BP:  (!) 172/90    Pulse:  77    Resp:  19    Temp:  97.7 F (36.5 C)    TempSrc:  Oral    SpO2: 97% 98% 98% 100%  Weight:      Height:        Intake/Output Summary (Last 24 hours) at 11/26/2020 1884 Last data filed at 11/25/2020 1700 Gross per 24 hour  Intake 480 ml  Output --  Net 480 ml   Weight change:  Exam:  General:  Pt is alert, follows commands appropriately, not in acute distress HEENT: No icterus, No thrush, No neck mass, /AT Cardiovascular: RRR, S1/S2, no rubs, no gallops Respiratory: bilateral rales.  Bilateral wheeze Abdomen: Soft/+BS, non tender, non distended, no guarding Extremities: No edema, No lymphangitis, No petechiae, No rashes, no synovitis  Data Reviewed: I have personally reviewed following labs and imaging studies Basic Metabolic Panel: Recent Labs  Lab 11/24/20 0812 11/25/20 0604 11/26/20 0610  NA 137 137 138  K 4.3 3.8 4.1  CL 101 104 104  CO2 29 25 26   GLUCOSE 108* 130* 131*  BUN 13 18 19   CREATININE 0.82 0.75 0.83  CALCIUM 9.2 9.4 9.3  MG  --  1.9 2.0   Liver Function Tests: Recent Labs  Lab 11/25/20 0604 11/26/20 0610  AST 13* 14*  ALT 22 19  ALKPHOS 57 57  BILITOT 0.7 0.7  PROT 7.4 7.4  ALBUMIN 4.1 4.0   No results for input(s): LIPASE, AMYLASE in the  last 168 hours. No results for input(s): AMMONIA in the last 168 hours. Coagulation Profile: No results for input(s): INR, PROTIME in the last 168 hours. CBC: Recent Labs  Lab 11/24/20 0812 11/25/20 0604 11/26/20 0610  WBC 9.1 13.2* 15.1*  NEUTROABS  --  12.0* 14.0*  HGB 16.2 15.7 15.2  HCT 48.0 46.7 47.1  MCV 95.0 94.3 95.3  PLT 193 197 211   Cardiac Enzymes: No results for input(s): CKTOTAL, CKMB, CKMBINDEX, TROPONINI in the last 168 hours. BNP: Invalid input(s): POCBNP CBG: No results for input(s): GLUCAP in the last 168 hours. HbA1C: No results for input(s): HGBA1C in the last 72 hours. Urine analysis:    Component Value Date/Time   COLORURINE STRAW (A) 12/18/2018 0853   APPEARANCEUR CLEAR 12/18/2018 0853   LABSPEC 1.005 12/18/2018 0853   PHURINE 5.0 12/18/2018 0853   GLUCOSEU NEGATIVE 12/18/2018 0853   HGBUR NEGATIVE 12/18/2018 0853   BILIRUBINUR NEGATIVE 12/18/2018 0853   KETONESUR NEGATIVE 12/18/2018 0853   PROTEINUR NEGATIVE 12/18/2018 0853   NITRITE NEGATIVE 12/18/2018 0853   LEUKOCYTESUR NEGATIVE 12/18/2018 0853   Sepsis Labs: @LABRCNTIP (procalcitonin:4,lacticidven:4) ) Recent Results (from the past 240 hour(s))  Resp Panel by RT-PCR (Flu A&B, Covid) Nasopharyngeal Swab     Status: None   Collection Time: 11/24/20  7:39 AM   Specimen: Nasopharyngeal Swab; Nasopharyngeal(NP) swabs in vial transport medium  Result Value Ref Range Status   SARS Coronavirus 2 by RT PCR NEGATIVE NEGATIVE Final    Comment: (NOTE) SARS-CoV-2 target nucleic acids are NOT DETECTED.  The SARS-CoV-2 RNA is generally detectable in upper respiratory specimens during the acute phase of infection. The lowest concentration of SARS-CoV-2 viral copies this assay can detect is 138 copies/mL. A negative result does not preclude SARS-Cov-2 infection and should not be used as the sole basis for treatment or other patient management decisions. A negative result may occur with  improper  specimen collection/handling, submission of specimen other than nasopharyngeal swab, presence of viral mutation(s) within the areas targeted by this assay, and inadequate number of viral copies(<138 copies/mL). A negative result must be combined with clinical observations, patient history, and epidemiological information. The expected result is Negative.  Fact Sheet for Patients:  12/20/2018  Fact Sheet for Healthcare Providers:   This test is no t yet approved or cleared by the 01/24/21 FDA and  has been authorized for detection and/or diagnosis of SARS-CoV-2 by FDA under an Emergency Use Authorization (EUA). This EUA will remain  in effect (meaning this test can be used) for the duration of the COVID-19 declaration under Section 564(b)(1) of the Act, 21 U.S.C.section 360bbb-3(b)(1), unless the authorization is terminated  or revoked sooner.       Influenza A by PCR NEGATIVE NEGATIVE Final   Influenza B by PCR NEGATIVE  NEGATIVE Final    Comment: (NOTE) The Xpert Xpress SARS-CoV-2/FLU/RSV plus assay is intended as an aid in the diagnosis of influenza from Nasopharyngeal swab specimens and should not be used as a sole basis for treatment. Nasal washings and aspirates are unacceptable for Xpert Xpress SARS-CoV-2/FLU/RSV testing.  Fact Sheet for Patients: BloggerCourse.com  Fact Sheet for Healthcare Providers: SeriousBroker.it  This test is not yet approved or cleared by the [place] FDA and has been authorized for detection and/or diagnosis of SARS-CoV-2 by FDA under an Emergency Use Authorization (EUA). This EUA will remain in effect (meaning this test can be used) for the duration of the COVID-19 declaration under Section 564(b)(1) of the Act, 21 U.S.C. section 360bbb-3(b)(1), unless the authorization is terminated or revoked.  Performed at  [facility], [address] [place], [place] [address]      Scheduled Meds:  apixaban  5 mg Oral BID   arformoterol  15 mcg Nebulization BID   atorvastatin  40 mg Oral QPM   budesonide (PULMICORT) nebulizer solution  0.5 mg Nebulization BID   carvedilol  25 mg Oral BID WC   doxycycline  100 mg Oral Q12H   furosemide  20 mg Oral Daily   guaiFENesin  1,200 mg Oral BID   hydrALAZINE  50 mg Oral BID   ipratropium-albuterol  3 mL Nebulization Q6H   isosorbide mononitrate  30 mg Oral Daily   methylPREDNISolone (SOLU-MEDROL) injection  60 mg Intravenous Q12H   nicotine  21 mg Transdermal Daily   pantoprazole  40 mg Oral Q0600   Continuous Infusions:  Procedures/Studies: DG Chest Port 1 View  Result Date: 11/24/2020 CLINICAL DATA:  60 year old male with history of dyspnea and productive cough. EXAM: PORTABLE CHEST 1 VIEW COMPARISON:  Chest x-ray 10/17/2020. FINDINGS: Lung volumes are normal. Mild diffuse peribronchial cuffing. No consolidative airspace disease. No pleural effusions. No pneumothorax. No pulmonary nodule or mass noted. Pulmonary vasculature and the cardiomediastinal silhouette are within normal limits. Atherosclerosis in the thoracic aorta. IMPRESSION: 1. Mild diffuse peribronchial cuffing, similar to the prior study, suggestive of chronic bronchitis. 2. Aortic atherosclerosis. Electronically Signed   By: Trudie Reed M.D.   On: 11/24/2020 08:02    Catarina Hartshorn, DO  [facility]  If 7PM-7AM, please contact night-coverage [email or web address] Password [email or web address] 11/26/2020, 9:07 AM   LOS: 2 days

## 2020-11-27 DIAGNOSIS — I480 Paroxysmal atrial fibrillation: Secondary | ICD-10-CM | POA: Diagnosis not present

## 2020-11-27 DIAGNOSIS — J9621 Acute and chronic respiratory failure with hypoxia: Secondary | ICD-10-CM | POA: Diagnosis not present

## 2020-11-27 DIAGNOSIS — J441 Chronic obstructive pulmonary disease with (acute) exacerbation: Secondary | ICD-10-CM | POA: Diagnosis not present

## 2020-11-27 MED ORDER — DOXYCYCLINE HYCLATE 100 MG PO TABS: 100.0000 mg | ORAL_TABLET | Freq: Two times a day (BID) | ORAL | 0 refills | Status: AC

## 2020-11-27 MED ORDER — PREDNISONE 10 MG PO TABS: 60.0000 mg | ORAL_TABLET | Freq: Every day | ORAL | 0 refills | Status: AC

## 2020-11-27 MED ORDER — IPRATROPIUM-ALBUTEROL 0.5-2.5 (3) MG/3ML IN SOLN: 3.0000 mL | Freq: Four times a day (QID) | RESPIRATORY_TRACT | 1 refills | Status: AC

## 2020-11-27 MED ORDER — PREDNISONE 20 MG PO TABS: 60.0000 mg | ORAL_TABLET | Freq: Every day | ORAL | Status: DC

## 2020-11-27 NOTE — Discharge Summary (Addendum)
Physician Discharge Summary  Dustin Fuller QAE:[id number] DOB: May 18, 1960 DOA: 11/24/2020  PCP: [facility] Ort [facility]  Admit date: 11/24/2020 Discharge date: 11/27/2020  Admitted From: Home Disposition:  Home   Recommendations for Outpatient Follow-up:  Follow up with PCP in 1-2 weeks Please obtain BMP/CBC in one week    Discharge Condition: Stable CODE STATUS:FULL Diet recommendation: Heart Healthy   Brief/Interim Summary: 60 y.o. male with medical history significant of copd, htn, hyperlipidemia, cva, and afib. He presents 0700 on 11/24/20 with shortness of breath since 10/2. He reports taking a breathing treatment at home without improvement.  He complains of nonproductive cough He denies fevers, chills, nausea, vomiting, cp, hemoptysis. He reports an occasional dry nonproductive cough. He initially was notable wheezing in all fields, currently is mildly wheezing in all fields with good air movement in all fields. No jvd, no pedal edema, no lower extremity edema. Reports he uses his inhaled corticosteroids as prescribed, still smokes 0.1/2 ppd, plans to quit with this most recent exacerbation.  Reports infrequent use of MDI, possibly 1 use per week.     ED Course: As he was initially hypertensive, reports he did not take his medications this morning and is also in respiratory distress, 212/120 with a heart rate of 87, IV hydralazine was given, as well as transdermal ntg with consideration of CHF history, carvedilol, mag sulfate, duoneb, albuterol, atrovent, and methylprednisolone. Bipap was attempted at one point, which he did not tolerate.  He was placed on [place] and remained stable.    Discharge Diagnoses:  Acute Respiratory Failure with Hypoxia -initially on BiPAP -initially saturation 80s -now stable on 2L>>weaned to RA -ambulatory pulse ox on day of d/c did not show any oxygen desaturation   COPD Exacerbation -personally reviewed CXR--chronic bronchitic markings -started  pulmicort -started [place] -continue duoneb -continue IV solumedrol>>d/c home with po prednisone -d/c home with 2 more days doxycycline   HTN -continue coreg, hydralazine, imdur   Tobacco Abuse -cessation discussed -nicoderm patch   Paroxysmal Afib -currently in sinus -continue apixaban   Hx of Stroke -continue apixaban   Hyperlipidemia -continue statin   Discharge Instructions   Allergies as of 11/27/2020       Reactions   Diltiazem Other (See Comments)   IV   Lisinopril Swelling   angioedema   Penicillins Other (See Comments)   Tiotropium Bromide Monohydrate Other (See Comments)   Reports 'throat swells'        Medication List     TAKE these medications    albuterol 108 (90 Base) MCG/ACT inhaler Commonly known as: Proventil HFA Inhale 2 puffs into the lungs every 4 (four) hours as needed for wheezing or shortness of breath.   apixaban 5 MG Tabs tablet Commonly known as: Eliquis Take 1 tablet (5 mg total) by mouth 2 (two) times daily.   atorvastatin 40 MG tablet Commonly known as: LIPITOR Take 1 tablet (40 mg total) by mouth every evening.   carvedilol 25 MG tablet Commonly known as: COREG Take 1 tablet (25 mg total) by mouth 2 (two) times daily with a meal.   doxycycline 100 MG tablet Commonly known as: VIBRA-TABS Take 1 tablet (100 mg total) by mouth every 12 (twelve) hours.   furosemide 20 MG tablet Commonly known as: LASIX Take 20 mg by mouth daily.   hydrALAZINE 50 MG tablet Commonly known as: APRESOLINE Take 1 tablet (50 mg total) by mouth in the morning and at bedtime.   ipratropium-albuterol 0.5-2.5 (3) MG/3ML Soln Commonly  known as: DUONEB Take 3 mLs by nebulization every 6 (six) hours.   isosorbide mononitrate 30 MG 24 hr tablet Commonly known as: IMDUR Take 1 tablet (30 mg total) by mouth daily.   predniSONE 10 MG tablet Commonly known as: DELTASONE Take 6 tablets (60 mg total) by mouth daily with breakfast. And decrease  by one tablet daily Start taking on: November 28, 2020   Symbicort 80-4.5 MCG/ACT inhaler Generic drug: budesonide-formoterol Inhale 2 puffs into the lungs 2 (two) times daily.        Allergies  Allergen Reactions   Diltiazem Other (See Comments)    IV   Lisinopril Swelling    angioedema   Penicillins Other (See Comments)   Tiotropium Bromide Monohydrate Other (See Comments)    Reports 'throat swells'    Consultations: none   Procedures/Studies: DG Chest Port 1 View  Result Date: 11/24/2020 CLINICAL DATA:  60 year old male with history of dyspnea and productive cough. EXAM: PORTABLE CHEST 1 VIEW COMPARISON:  Chest x-ray 10/17/2020. FINDINGS: Lung volumes are normal. Mild diffuse peribronchial cuffing. No consolidative airspace disease. No pleural effusions. No pneumothorax. No pulmonary nodule or mass noted. Pulmonary vasculature and the cardiomediastinal silhouette are within normal limits. Atherosclerosis in the thoracic aorta. IMPRESSION: 1. Mild diffuse peribronchial cuffing, similar to the prior study, suggestive of chronic bronchitis. 2. Aortic atherosclerosis. Electronically Signed   By: Trudie Reed M.D.   On: 11/24/2020 08:02        Discharge Exam: Vitals:   11/27/20 1341 11/27/20 1430  BP: 127/69   Pulse: 77   Resp: 18   Temp: 98.5 F (36.9 C)   SpO2: 97% 96%   Vitals:   11/27/20 0904 11/27/20 0926 11/27/20 1341 11/27/20 1430  BP:  (!) 152/80 127/69   Pulse:  79 77   Resp:   18   Temp:   98.5 F (36.9 C)   TempSrc:   Oral   SpO2: 100% 99% 97% 96%  Weight:      Height:        General: Pt is alert, awake, not in acute distress Cardiovascular: RRR, S1/S2 +, no rubs, no gallops Respiratory: scattered bibasilar rales.  Fine bibasilar wheeze Abdominal: Soft, NT, ND, bowel sounds + Extremities: no edema, no cyanosis   The results of significant diagnostics from this hospitalization (including imaging, microbiology, ancillary and laboratory) are  listed below for reference.    Significant Diagnostic Studies: DG Chest Port 1 View  Result Date: 11/24/2020 CLINICAL DATA:  60 year old male with history of dyspnea and productive cough. EXAM: PORTABLE CHEST 1 VIEW COMPARISON:  Chest x-ray 10/17/2020. FINDINGS: Lung volumes are normal. Mild diffuse peribronchial cuffing. No consolidative airspace disease. No pleural effusions. No pneumothorax. No pulmonary nodule or mass noted. Pulmonary vasculature and the cardiomediastinal silhouette are within normal limits. Atherosclerosis in the thoracic aorta. IMPRESSION: 1. Mild diffuse peribronchial cuffing, similar to the prior study, suggestive of chronic bronchitis. 2. Aortic atherosclerosis. Electronically Signed   By: Trudie Reed M.D.   On: 11/24/2020 08:02    Microbiology: Recent Results (from the past 240 hour(s))  Resp Panel by RT-PCR (Flu A&B, Covid) Nasopharyngeal Swab     Status: None   Collection Time: 11/24/20  7:39 AM   Specimen: Nasopharyngeal Swab; Nasopharyngeal(NP) swabs in vial transport medium  Result Value Ref Range Status   SARS Coronavirus 2 by RT PCR NEGATIVE NEGATIVE Final    Comment: (NOTE) SARS-CoV-2 target nucleic acids are NOT DETECTED.  The SARS-CoV-2 RNA is  generally detectable in upper respiratory specimens during the acute phase of infection. The lowest concentration of SARS-CoV-2 viral copies this assay can detect is 138 copies/mL. A negative result does not preclude SARS-Cov-2 infection and should not be used as the sole basis for treatment or other patient management decisions. A negative result may occur with  improper specimen collection/handling, submission of specimen other than nasopharyngeal swab, presence of viral mutation(s) within the areas targeted by this assay, and inadequate number of viral copies(<138 copies/mL). A negative result must be combined with clinical observations, patient history, and epidemiological information. The expected  result is Negative.  Fact Sheet for Patients:  BloggerCourse.com  Fact Sheet for Healthcare Providers:  SeriousBroker.it  This test is no t yet approved or cleared by the [place] FDA and  has been authorized for detection and/or diagnosis of SARS-CoV-2 by FDA under an Emergency Use Authorization (EUA). This EUA will remain  in effect (meaning this test can be used) for the duration of the COVID-19 declaration under Section 564(b)(1) of the Act, 21 U.S.C.section 360bbb-3(b)(1), unless the authorization is terminated  or revoked sooner.       Influenza A by PCR NEGATIVE NEGATIVE Final   Influenza B by PCR NEGATIVE NEGATIVE Final    Comment: (NOTE) The Xpert Xpress SARS-CoV-2/FLU/RSV plus assay is intended as an aid in the diagnosis of influenza from Nasopharyngeal swab specimens and should not be used as a sole basis for treatment. Nasal washings and aspirates are unacceptable for Xpert Xpress SARS-CoV-2/FLU/RSV testing.  Fact Sheet for Patients: BloggerCourse.com  Fact Sheet for Healthcare Providers: SeriousBroker.it  This test is not yet approved or cleared by the [place] FDA and has been authorized for detection and/or diagnosis of SARS-CoV-2 by FDA under an Emergency Use Authorization (EUA). This EUA will remain in effect (meaning this test can be used) for the duration of the COVID-19 declaration under Section 564(b)(1) of the Act, 21 U.S.C. section 360bbb-3(b)(1), unless the authorization is terminated or revoked.  Performed at [facility], [address] [place], [place] [address]      Labs: Basic Metabolic Panel: Recent Labs  Lab 11/24/20 0812 11/25/20 0604 11/26/20 0610  NA 137 137 138  K 4.3 3.8 4.1  CL 101 104 104  CO2 29 25 26   GLUCOSE 108* 130* 131*  BUN 13 18 19   CREATININE 0.82 0.75 0.83  CALCIUM 9.2 9.4 9.3  MG  --  1.9 2.0   Liver  Function Tests: Recent Labs  Lab 11/25/20 0604 11/26/20 0610  AST 13* 14*  ALT 22 19  ALKPHOS 57 57  BILITOT 0.7 0.7  PROT 7.4 7.4  ALBUMIN 4.1 4.0   No results for input(s): LIPASE, AMYLASE in the last 168 hours. No results for input(s): AMMONIA in the last 168 hours. CBC: Recent Labs  Lab 11/24/20 0812 11/25/20 0604 11/26/20 0610  WBC 9.1 13.2* 15.1*  NEUTROABS  --  12.0* 14.0*  HGB 16.2 15.7 15.2  HCT 48.0 46.7 47.1  MCV 95.0 94.3 95.3  PLT 193 197 211   Cardiac Enzymes: No results for input(s): CKTOTAL, CKMB, CKMBINDEX, TROPONINI in the last 168 hours. BNP: Invalid input(s): POCBNP CBG: No results for input(s): GLUCAP in the last 168 hours.  Time coordinating discharge:  36 minutes  Signed:  01/25/21, DO [facility] Pager: [phone] 11/27/2020, 3:37 PM

## 2020-11-27 NOTE — Progress Notes (Signed)
Nsg Discharge Note  Admit Date:  11/24/2020 Discharge date: 11/27/2020   Gaston Dase to be D/C'd Home per MD order.  AVS completed.  Copy for chart, and copy for patient signed, and dated. Patient/caregiver able to verbalize understanding.  Discharge Medication: Allergies as of 11/27/2020       Reactions   Diltiazem Other (See Comments)   IV   Lisinopril Swelling   angioedema   Penicillins Other (See Comments)   Tiotropium Bromide Monohydrate Other (See Comments)   Reports 'throat swells'        Medication List     TAKE these medications    albuterol 108 (90 Base) MCG/ACT inhaler Commonly known as: Proventil HFA Inhale 2 puffs into the lungs every 4 (four) hours as needed for wheezing or shortness of breath.   apixaban 5 MG Tabs tablet Commonly known as: Eliquis Take 1 tablet (5 mg total) by mouth 2 (two) times daily.   atorvastatin 40 MG tablet Commonly known as: LIPITOR Take 1 tablet (40 mg total) by mouth every evening.   carvedilol 25 MG tablet Commonly known as: COREG Take 1 tablet (25 mg total) by mouth 2 (two) times daily with a meal.   doxycycline 100 MG tablet Commonly known as: VIBRA-TABS Take 1 tablet (100 mg total) by mouth every 12 (twelve) hours.   furosemide 20 MG tablet Commonly known as: LASIX Take 20 mg by mouth daily.   hydrALAZINE 50 MG tablet Commonly known as: APRESOLINE Take 1 tablet (50 mg total) by mouth in the morning and at bedtime.   ipratropium-albuterol 0.5-2.5 (3) MG/3ML Soln Commonly known as: DUONEB Take 3 mLs by nebulization every 6 (six) hours.   isosorbide mononitrate 30 MG 24 hr tablet Commonly known as: IMDUR Take 1 tablet (30 mg total) by mouth daily.   predniSONE 10 MG tablet Commonly known as: DELTASONE Take 6 tablets (60 mg total) by mouth daily with breakfast. And decrease by one tablet daily Start taking on: November 28, 2020   Symbicort 80-4.5 MCG/ACT inhaler Generic drug: budesonide-formoterol Inhale  2 puffs into the lungs 2 (two) times daily.        Discharge Assessment: Vitals:   11/27/20 1341 11/27/20 1430  BP: 127/69   Pulse: 77   Resp: 18   Temp: 98.5 F (36.9 C)   SpO2: 97% 96%   Skin clean, dry and intact without evidence of skin break down, no evidence of skin tears noted. IV catheter discontinued intact. Site without signs and symptoms of complications - no redness or edema noted at insertion site, patient denies c/o pain - only slight tenderness at site.  Dressing with slight pressure applied.  D/c Instructions-Education: Discharge instructions given to patient/family with verbalized understanding. D/c education completed with patient/family including follow up instructions, medication list, d/c activities limitations if indicated, with other d/c instructions as indicated by MD - patient able to verbalize understanding, all questions fully answered. Patient instructed to return to ED, call 911, or call MD for any changes in condition.  Patient escorted via WC, and D/C home via private auto.  Brandy Hale, RN 11/27/2020 4:27 PM

## 2022-04-23 DEATH — deceased
